# Patient Record
Sex: Male | Born: 1950 | Race: White | Hispanic: No | Marital: Married | Smoking: Former smoker
Health system: Southern US, Community
[De-identification: ages and names within clinical notes are randomized; demographics above are authoritative.]

## PROBLEM LIST (undated history)

## (undated) DIAGNOSIS — Z9109 Other allergy status, other than to drugs and biological substances: Secondary | ICD-10-CM

## (undated) DIAGNOSIS — J329 Chronic sinusitis, unspecified: Secondary | ICD-10-CM

## (undated) DIAGNOSIS — E785 Hyperlipidemia, unspecified: Secondary | ICD-10-CM

## (undated) DIAGNOSIS — C439 Malignant melanoma of skin, unspecified: Secondary | ICD-10-CM

## (undated) DIAGNOSIS — H269 Unspecified cataract: Secondary | ICD-10-CM

## (undated) DIAGNOSIS — T7840XA Allergy, unspecified, initial encounter: Secondary | ICD-10-CM

## (undated) DIAGNOSIS — I2510 Atherosclerotic heart disease of native coronary artery without angina pectoris: Secondary | ICD-10-CM

## (undated) HISTORY — DX: Chronic sinusitis, unspecified: J32.9

## (undated) HISTORY — DX: Atherosclerotic heart disease of native coronary artery without angina pectoris: I25.10

## (undated) HISTORY — DX: Hyperlipidemia, unspecified: E78.5

## (undated) HISTORY — DX: Unspecified cataract: H26.9

## (undated) HISTORY — DX: Malignant melanoma of skin, unspecified: C43.9

## (undated) HISTORY — PX: MOHS SURGERY: SUR867

## (undated) HISTORY — DX: Allergy, unspecified, initial encounter: T78.40XA

## (undated) HISTORY — PX: COLONOSCOPY: SHX174

## (undated) HISTORY — DX: Other allergy status, other than to drugs and biological substances: Z91.09

## (undated) HISTORY — PX: POLYPECTOMY: SHX149

---
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
## 1988-11-16 HISTORY — PX: HERNIA REPAIR: SHX51

## 2001-01-10 ENCOUNTER — Encounter: Payer: Self-pay | Admitting: Family Medicine

## 2001-01-10 LAB — CONVERTED CEMR LAB
Blood Glucose, Fasting: 92 mg/dL
PSA: 0.66 ng/mL
WBC, blood: 8.6 10*3/uL

## 2002-07-12 ENCOUNTER — Encounter: Payer: Self-pay | Admitting: Family Medicine

## 2002-07-12 LAB — CONVERTED CEMR LAB
Blood Glucose, Fasting: 105 mg/dL
PSA: 0.8 ng/mL
TSH: 2.47 microintl units/mL

## 2003-07-24 ENCOUNTER — Encounter: Payer: Self-pay | Admitting: Family Medicine

## 2003-07-24 LAB — CONVERTED CEMR LAB
Blood Glucose, Fasting: 102 mg/dL
PSA: 0.6 ng/mL
TSH: 1.83 microintl units/mL

## 2004-09-26 ENCOUNTER — Ambulatory Visit: Payer: Self-pay | Admitting: Family Medicine

## 2004-12-22 ENCOUNTER — Ambulatory Visit: Payer: Self-pay | Admitting: Family Medicine

## 2004-12-22 LAB — CONVERTED CEMR LAB
Blood Glucose, Fasting: 100 mg/dL
PSA: 0.72 ng/mL
TSH: 1.95 microintl units/mL

## 2006-05-06 ENCOUNTER — Ambulatory Visit: Payer: Self-pay | Admitting: Family Medicine

## 2006-09-29 ENCOUNTER — Ambulatory Visit: Payer: Self-pay | Admitting: Family Medicine

## 2006-09-29 LAB — CONVERTED CEMR LAB
Blood Glucose, Fasting: 105 mg/dL
PSA: 0.99 ng/mL
TSH: 2.2 microintl units/mL

## 2006-10-05 ENCOUNTER — Ambulatory Visit: Payer: Self-pay | Admitting: Family Medicine

## 2007-03-09 ENCOUNTER — Ambulatory Visit: Payer: Self-pay | Admitting: Family Medicine

## 2007-03-24 ENCOUNTER — Telehealth (INDEPENDENT_AMBULATORY_CARE_PROVIDER_SITE_OTHER): Payer: Self-pay | Admitting: *Deleted

## 2007-10-10 ENCOUNTER — Telehealth (INDEPENDENT_AMBULATORY_CARE_PROVIDER_SITE_OTHER): Payer: Self-pay | Admitting: *Deleted

## 2008-12-31 ENCOUNTER — Ambulatory Visit: Payer: Self-pay | Admitting: Family Medicine

## 2008-12-31 LAB — CONVERTED CEMR LAB
ALT: 21 units/L (ref 0–53)
AST: 18 units/L (ref 0–37)
Albumin: 3.7 g/dL (ref 3.5–5.2)
Alkaline Phosphatase: 62 units/L (ref 39–117)
BUN: 17 mg/dL (ref 6–23)
Basophils Absolute: 0.1 10*3/uL (ref 0.0–0.1)
Basophils Relative: 1 % (ref 0.0–3.0)
Bilirubin, Direct: 0.1 mg/dL (ref 0.0–0.3)
CO2: 27 meq/L (ref 19–32)
Calcium: 9.4 mg/dL (ref 8.4–10.5)
Chloride: 105 meq/L (ref 96–112)
Cholesterol: 188 mg/dL (ref 0–200)
Creatinine, Ser: 1 mg/dL (ref 0.4–1.5)
Creatinine,U: 262.8 mg/dL
Eosinophils Absolute: 0.1 10*3/uL (ref 0.0–0.7)
Eosinophils Relative: 2 % (ref 0.0–5.0)
GFR calc Af Amer: 99 mL/min
GFR calc non Af Amer: 82 mL/min
Glucose, Bld: 104 mg/dL — ABNORMAL HIGH (ref 70–99)
HCT: 43 % (ref 39.0–52.0)
HDL: 27.1 mg/dL — ABNORMAL LOW (ref >39.0)
Hemoglobin: 15 g/dL (ref 13.0–17.0)
LDL Cholesterol: 138 mg/dL — ABNORMAL HIGH (ref 0–99)
Lymphocytes Relative: 27.8 % (ref 12.0–46.0)
MCHC: 34.8 g/dL (ref 30.0–36.0)
MCV: 90.4 fL (ref 78.0–100.0)
Microalb, Ur: 0.2 mg/dL (ref 0.0–1.9)
Monocytes Absolute: 0.8 10*3/uL (ref 0.1–1.0)
Monocytes Relative: 10.7 % (ref 3.0–12.0)
Neutro Abs: 4.1 10*3/uL (ref 1.4–7.7)
Neutrophils Relative %: 58.5 % (ref 43.0–77.0)
PSA: 1.08 ng/mL (ref 0.10–4.00)
Platelets: 176 10*3/uL (ref 150–400)
Potassium: 4.3 meq/L (ref 3.5–5.1)
RBC: 4.76 M/uL (ref 4.22–5.81)
RDW: 13.1 % (ref 11.5–14.6)
Sodium: 140 meq/L (ref 135–145)
TSH: 2.51 microintl units/mL (ref 0.35–5.50)
Total Bilirubin: 0.6 mg/dL (ref 0.3–1.2)
Total CHOL/HDL Ratio: 6.9
Total Protein: 6.5 g/dL (ref 6.0–8.3)
Triglycerides: 113 mg/dL (ref 0–149)
VLDL: 23 mg/dL (ref 0–40)
WBC: 7.1 10*3/uL (ref 4.5–10.5)

## 2009-01-03 ENCOUNTER — Ambulatory Visit: Payer: Self-pay | Admitting: Family Medicine

## 2009-01-04 ENCOUNTER — Encounter: Payer: Self-pay | Admitting: Family Medicine

## 2009-01-07 DIAGNOSIS — N529 Male erectile dysfunction, unspecified: Secondary | ICD-10-CM | POA: Insufficient documentation

## 2009-01-07 DIAGNOSIS — E785 Hyperlipidemia, unspecified: Secondary | ICD-10-CM | POA: Insufficient documentation

## 2009-01-07 DIAGNOSIS — R739 Hyperglycemia, unspecified: Secondary | ICD-10-CM | POA: Insufficient documentation

## 2009-01-07 DIAGNOSIS — T7840XA Allergy, unspecified, initial encounter: Secondary | ICD-10-CM | POA: Insufficient documentation

## 2009-06-18 ENCOUNTER — Telehealth: Payer: Self-pay | Admitting: Family Medicine

## 2010-01-01 ENCOUNTER — Encounter (INDEPENDENT_AMBULATORY_CARE_PROVIDER_SITE_OTHER): Payer: Self-pay | Admitting: *Deleted

## 2010-01-06 ENCOUNTER — Ambulatory Visit: Payer: Self-pay | Admitting: Family Medicine

## 2010-01-06 DIAGNOSIS — F17200 Nicotine dependence, unspecified, uncomplicated: Secondary | ICD-10-CM | POA: Insufficient documentation

## 2010-01-06 LAB — CONVERTED CEMR LAB
ALT: 17 units/L (ref 0–53)
AST: 17 units/L (ref 0–37)
Albumin: 3.7 g/dL (ref 3.5–5.2)
Alkaline Phosphatase: 73 units/L (ref 39–117)
BUN: 13 mg/dL (ref 6–23)
Basophils Absolute: 0 10*3/uL (ref 0.0–0.1)
Basophils Relative: 0.4 % (ref 0.0–3.0)
Bilirubin, Direct: 0.1 mg/dL (ref 0.0–0.3)
CO2: 27 meq/L (ref 19–32)
Calcium: 8.7 mg/dL (ref 8.4–10.5)
Chloride: 110 meq/L (ref 96–112)
Cholesterol: 182 mg/dL (ref 0–200)
Creatinine, Ser: 0.9 mg/dL (ref 0.4–1.5)
Creatinine,U: 236.1 mg/dL
Eosinophils Absolute: 0.1 10*3/uL (ref 0.0–0.7)
Eosinophils Relative: 2 % (ref 0.0–5.0)
GFR calc non Af Amer: 92.01 mL/min (ref >60)
Glucose, Bld: 99 mg/dL (ref 70–99)
HCT: 44.8 % (ref 39.0–52.0)
HDL: 36.2 mg/dL — ABNORMAL LOW (ref >39.00)
Hemoglobin: 15 g/dL (ref 13.0–17.0)
LDL Cholesterol: 127 mg/dL — ABNORMAL HIGH (ref 0–99)
Lymphocytes Relative: 27.6 % (ref 12.0–46.0)
Lymphs Abs: 1.8 10*3/uL (ref 0.7–4.0)
MCHC: 33.5 g/dL (ref 30.0–36.0)
MCV: 92.6 fL (ref 78.0–100.0)
Microalb Creat Ratio: 3 mg/g (ref 0.0–30.0)
Microalb, Ur: 0.7 mg/dL (ref 0.0–1.9)
Monocytes Absolute: 0.5 10*3/uL (ref 0.1–1.0)
Monocytes Relative: 8.2 % (ref 3.0–12.0)
Neutro Abs: 4 10*3/uL (ref 1.4–7.7)
Neutrophils Relative %: 61.8 % (ref 43.0–77.0)
PSA: 0.86 ng/mL (ref 0.10–4.00)
Platelets: 163 10*3/uL (ref 150.0–400.0)
Potassium: 4.3 meq/L (ref 3.5–5.1)
RBC: 4.84 M/uL (ref 4.22–5.81)
RDW: 12.8 % (ref 11.5–14.6)
Sodium: 142 meq/L (ref 135–145)
TSH: 2.77 microintl units/mL (ref 0.35–5.50)
Total Bilirubin: 0.4 mg/dL (ref 0.3–1.2)
Total CHOL/HDL Ratio: 5
Total Protein: 6.4 g/dL (ref 6.0–8.3)
Triglycerides: 96 mg/dL (ref 0.0–149.0)
VLDL: 19.2 mg/dL (ref 0.0–40.0)
WBC: 6.4 10*3/uL (ref 4.5–10.5)

## 2010-01-08 ENCOUNTER — Ambulatory Visit: Payer: Self-pay | Admitting: Family Medicine

## 2010-01-08 ENCOUNTER — Encounter (INDEPENDENT_AMBULATORY_CARE_PROVIDER_SITE_OTHER): Payer: Self-pay | Admitting: *Deleted

## 2010-01-08 LAB — FECAL OCCULT BLOOD, GUAIAC: Fecal Occult Blood: NEGATIVE

## 2010-01-08 LAB — CONVERTED CEMR LAB
OCCULT 1: NEGATIVE
OCCULT 2: NEGATIVE
OCCULT 3: NEGATIVE

## 2010-05-16 DIAGNOSIS — Z85820 Personal history of malignant melanoma of skin: Secondary | ICD-10-CM | POA: Insufficient documentation

## 2010-05-16 DIAGNOSIS — C439 Malignant melanoma of skin, unspecified: Secondary | ICD-10-CM | POA: Insufficient documentation

## 2010-06-16 DIAGNOSIS — C439 Malignant melanoma of skin, unspecified: Secondary | ICD-10-CM

## 2010-06-16 HISTORY — DX: Malignant melanoma of skin, unspecified: C43.9

## 2010-06-19 ENCOUNTER — Encounter: Payer: Self-pay | Admitting: Family Medicine

## 2010-06-24 ENCOUNTER — Encounter (INDEPENDENT_AMBULATORY_CARE_PROVIDER_SITE_OTHER): Payer: Self-pay | Admitting: *Deleted

## 2010-12-16 NOTE — Progress Notes (Signed)
Summary: levitra rx  Medications Added LEVITRA 10 MG TABS (VARDENAFIL HCL)  LEVITRA 20 MG  TABS (VARDENAFIL HCL) one tab by mouth q one hour prior       Phone Note Call from Patient Call back at Work Phone [phone]   Caller: Patient Call For: schaller Summary of Call: pt wants levitra rx but he wants in increased to 20 mg Initial call taken by: Liane Comber,  October 10, 2007 4:03 PM  Follow-up for Phone Call        Advised patient.  ......................................................Marland KitchenLiane Comber October 10, 2007 5:06 PM Rx done, was sent electronically. ....................................................Marland KitchenMarcelle Smiling Ames Hoban October 10, 2007 5:06 PM     New/Updated Medications: LEVITRA 10 MG TABS (VARDENAFIL HCL)  LEVITRA 20 MG  TABS (VARDENAFIL HCL) one tab by mouth q one hour prior   Prescriptions: LEVITRA 20 MG  TABS (VARDENAFIL HCL) one tab by mouth q one hour prior  #15/per plan x 12   Entered by:   Liane Comber   Authorized by:   Shaune Leeks MD   Signed by:   Liane Comber on 10/10/2007   Method used:   Electronically sent to ...       [facility] [phone]*       [address]       [place], [place]  [address]       Ph: [phone] or [phone]       Fax: [phone]   RxID:   [id number] LEVITRA 20 MG  TABS (VARDENAFIL HCL) one tab by mouth q one hour prior  #15/per plan x 12   Entered and Authorized by:   Shaune Leeks MD   Signed by:   Shaune Leeks MD on 10/10/2007   Method used:   Print then Give to Patient   RxID:   [id number]

## 2010-12-16 NOTE — Letter (Signed)
Summary: [facility]  [facility]   Imported By: Lanelle Bal 06/30/2010 09:35:59  _____________________________________________________________________  External Attachment:    Type:   Image     Comment:   External Document  Appended Document: [facility]    Clinical Lists Changes  Observations: Added new observation of PAST SURG HX: Appy 1965 HOSP Severe Sinusitis 1968 Hernia Repair 1990 MOHS for melanoma, [facility] 06/2010  (06/30/2010 13:29)       Past History:  Past Surgical History: Appy 1965 HOSP Severe Sinusitis 1968 Hernia Repair 1990 MOHS for melanoma, [facility] 06/2010

## 2010-12-16 NOTE — Progress Notes (Signed)
Summary: asking for ear drops  Phone Note Call from Patient Call back at Home Phone [phone]   Caller: Spouse Call For: Jorge Leeks MD Summary of Call: Pt is having a problem with his right ear-  pain.  He is asking for neosporin ear drops [facility] [address]  I advised his wife he might need an appt first. Initial call taken by: Lowella Petties CMA,  June 18, 2009 1:11 PM  Follow-up for Phone Call        Will assume he has otitis externa. Insure he comes in if sxs don't improve...use at least one week. Follow-up by: Jorge Leeks MD,  June 18, 2009 1:28 PM  Additional Follow-up for Phone Call Additional follow up Details #1::        Advised pt's wife. Additional Follow-up by: Lowella Petties CMA,  June 18, 2009 2:30 PM    New/Updated Medications: CORTISPORIN 3.5-10000-1 SOLN (NEOMYCIN-POLYMYXIN-HC) 2-3 drops affected ear three times a day for one week minimum Prescriptions: CORTISPORIN 3.5-10000-1 SOLN (NEOMYCIN-POLYMYXIN-HC) 2-3 drops affected ear three times a day for one week minimum  #1 bottle x 0   Entered and Authorized by:   Jorge Leeks MD   Signed by:   Jorge Leeks MD on 06/18/2009   Method used:   [facility] to        [facility] [phone] (retail)       [address] [place], [place]  [address]       Ph: [phone] or [phone]       Fax: [phone]   RxID:   [id number]

## 2010-12-16 NOTE — Assessment & Plan Note (Signed)
Summary: CPX/CLE   Vital Signs:  Patient profile:   60 year old male Height:      70.5 inches Weight:      249 pounds BMI:     35.35 Temp:     98.3 degrees F oral Pulse rate:   80 / minute Pulse rhythm:   regular BP sitting:   126 / 88  (left arm) Cuff size:   large  Vitals Entered By: Sydell Axon LPN (January 06, 2010 8:17 AM) CC: 30 Minute checkup, returned hemoccult cards today that were given to him last year at physical   History of Present Illness: Pt here for Comp Exam, doing well with no complaints.  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 As Needed Allergies 2)  Centrum Silver  Tabs (Multiple Vitamins-Minerals) .Marland Kitchen.. 1 Daily By Mouth 3)  Something Over The Counter .... For Levitra 4)  Cortisporin 3.5-10000-1 Soln (Neomycin-Polymyxin-Hc) .... 2-3 Drops Affected Ear Three Times A Day For One Week Minimum  Allergies: No Known Drug Allergies  Past History:  Family History: Last updated: 01/06/2010 Father A 83 Leaky Valve   Catarract Extractions Mother dec 77 Uterine Ca Radiation Colitis Liver and  Kidney Complcns  Hypothyr Sister A 42  Sister A 52 CV: NEGATIVE HBP: POSITIVE MOTHER DM: NEGATIVE GOUT/ARTHRITIS: PROSTATE CANCER: BREAST /OVARIAN/UTERINE  CANCER: 68 HYST MOTHER CHEMO AND RADIATION COLON CANCER: DEPRESSION: NEGATIVE ETOH/DRUG ABUSE: NEGATIVE OTHER: POSITIVE STROKE MGF  Social History: Last updated: 01/03/2009 Occupation:Travelling CPA Married  3  Risk Factors: Alcohol Use: <1 (01/06/2010) Caffeine Use: 0 (01/06/2010) Exercise: yes (01/06/2010)  Risk Factors: Smoking Status: current (01/06/2010) Packs/Day: 1/2 (01/06/2010)  Past Surgical History: Appy 1965 HOSP Severe Sinusitis 1968 Hernia Repair 1990  Family History: Father A 83 Leaky Valve   Catarract Extractions Mother dec 77 Uterine Ca Radiation Colitis Liver and  Kidney Complcns  Hypothyr Sister A 85  Sister A 52 CV: NEGATIVE HBP: POSITIVE MOTHER DM: NEGATIVE GOUT/ARTHRITIS: PROSTATE CANCER: BREAST /OVARIAN/UTERINE CANCER: 68 HYST MOTHER CHEMO AND RADIATION COLON CANCER: DEPRESSION: NEGATIVE ETOH/DRUG ABUSE: NEGATIVE OTHER: POSITIVE STROKE MGF  Social History: Packs/Day:  1/2  Review of Systems General:  Denies chills, fatigue, fever, sweats, weakness, and weight loss. Eyes:  Denies blurring, discharge, and eye pain. ENT:  Denies decreased hearing, ear discharge, and earache. CV:  Denies chest pain or discomfort, fainting, fatigue, palpitations, shortness of breath with exertion, swelling of feet, and swelling of hands. Resp:  Denies cough, shortness of breath, and wheezing. GI:  Denies abdominal pain, bloody stools, change in bowel habits, constipation, dark tarry stools, diarrhea, indigestion, loss of appetite, nausea, vomiting, vomiting blood, and yellowish skin color. GU:  Denies decreased libido, discharge, dysuria, erectile  dysfunction, nocturia, and urinary frequency. MS:  Denies joint pain, joint swelling, low back pain, muscle weakness, and stiffness. Derm:  Denies dryness, itching, and rash. Neuro:  Denies numbness, poor balance, tingling, and tremors.  Physical Exam  General:  Well-developed,well-nourished,in no acute distress;  alert,appropriate and cooperative throughout examination, mildly overweight. Head:  Normocephalic and atraumatic without obvious abnormalities. No apparent alopecia or balding, mild thinning of hair.. Eyes:  Conjunctiva clear bilaterally.  Ears:  External ear exam shows no significant lesions or deformities.  Otoscopic examination reveals clear canals, tympanic membranes are intact bilaterally without bulging, retraction, inflammation or discharge. Hearing is grossly normal bilaterally. Nose:  External nasal examination shows no deformity or inflammation. Nasal mucosa are pink and moist without lesions or exudates. Mouth:  Oral mucosa and oropharynx without lesions or exudates.  Teeth in good repair. Neck:  No deformities, masses, or tenderness noted. Chest Wall:  No deformities, masses, tenderness or gynecomastia noted. Breasts:  No masses or gynecomastia noted Lungs:  Normal respiratory effort, chest expands symmetrically. Lungs are clear to auscultation, no crackles or wheezes. Heart:  Normal rate and regular rhythm. S1 and S2 normal without gallop, murmur, click, rub or other extra sounds. Abdomen:  Bowel sounds positive,abdomen soft and non-tender without masses, organomegaly or hernias noted. Mild protuberance. Rectal:  No external abnormalities noted. Normal sphincter tone. No rectal masses or tenderness. G neg. Genitalia:  Testes bilaterally descended without nodularity, tenderness or masses. No scrotal masses or lesions. No penis lesions or urethral discharge. Prostate:  Prostate gland firm and smooth, no enlargement, nodularity, tenderness, mass, asymmetry or induration. 10 gms. Msk:  No deformity or scoliosis noted of thoracic or lumbar spine.   Pulses:  R and L carotid,radial,femoral,dorsalis pedis and posterior tibial pulses are full and equal bilaterally Extremities:  No clubbing, cyanosis, edema, or deformity noted with normal full range of motion of all joints.   Neurologic:  No  cranial nerve deficits noted. Station and gait are normal. Plantar reflexes are down-going bilaterally. DTRs are symmetrical throughout. Sensory, motor and coordinative functions appear intact. Skin:  Intact without suspicious lesions or rashes, mild mioles throughout. Cervical Nodes:  No lymphadenopathy noted Inguinal Nodes:  No significant adenopathy Psych:  Cognition and judgment appear intact. Alert and cooperative with normal attention span and concentration. 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  Problem # 3:  ALLERGY (ICD-995.3) Assessment: Unchanged Stable. Now taking Zyrtec and stable.  Problem # 4:  SPECIAL SCREENING MALIGNANT NEOPLASM OF PROSTATE (ICD-V76.44) Assessment: Unchanged Will get PSA today, exam stable.  Problem # 5:  NICOTINE ADDICTION (ICD-305.1) Assessment: Unchanged Encouraged to quit. Will help if necessary, declined thusfar.  Problem # 6:  HYPERLIPIDEMIA (ICD-272.4) Assessment: Unchanged Will recheck today. LDL slightly high HDL slightly low last year....if no better or is worse, would suggest Crestor 5mg  to try to improve both. Quickly discussed. Labs Reviewed: SGOT: 18 (12/31/2008)   SGPT: 21 (12/31/2008)   HDL:27.1 (12/31/2008)  LDL:138 (12/31/2008)  Chol:188 (12/31/2008)  Trig:113 (12/31/2008)  Problem # 7:  OTHER ABNORMAL GLUCOSE (ICD-790.29) Assessment: Unchanged  Will recheck today. Has been mildly elevated and encouraged again to be careful with sweets and carbs.  Labs Reviewed: Creat: 1.0 (12/31/2008)     Complete Medication List: 1)  Centrum Silver Tabs (Multiple vitamins-minerals) .Marland Kitchen.. 1 daily by mouth 2)  Something  Over The Counter  .... For levitra 3)  Zyrtec Allergy 10 Mg Caps (Cetirizine hcl) .... As needed 4)   Glucosamine 500 Mg Caps (Glucosamine sulfate) .... Take one by mouth daily  Patient Instructions: 1)  RTC as needed. 2)  Will report labs on phone tree and poss antic Crestor 5 if needed, which we discussed.  Current Allergies (reviewed today): No known allergies   Appended Document: CPX/CLE

## 2010-12-16 NOTE — Letter (Signed)
Summary: Nadara Eaton letter  [place] at [facility]  [address] [place], [place] [address]   Phone: [phone]  Fax: [phone]       06/24/2010 MRN: [id number]  AJENE CARCHI [address] [place], [place]  [address]  Dear Mr. Martelli,  [facility] [place] announce the retirement of Arta Silence, M.D., from full-time practice at the [facility] office effective May 15, 2010 and his plans of returning part-time.  It is important to Dr. Hetty Ely and to our practice that you understand that [facility] has seven physicians in our office for your health care needs.  We will continue to offer the same exceptional care that you have today.    Dr. Hetty Ely has spoken to many of you about his plans for retirement and returning part-time in the fall.   We will continue to work with you through the transition to schedule appointments for you in the office and meet the high standards that [facility] is committed to.   Again, it is with great pleasure that we share the news that Dr. Hetty Ely will return to [facility] in October of 2011 with a reduced schedule.    If you have any questions, or would like to request an appointment with one of our physicians, please call us at [phone] and press the option for Scheduling an appointment.  We take pleasure in providing you with excellent patient care and look forward to seeing you at your next office visit.  Our [facility] Physicians are:  Tillman Abide, M.D. Laurita Quint, M.D. Roxy Manns, M.D. Kerby Nora, M.D. Hannah Beat, M.D. Ruthe Mannan, M.D. We proudly welcomed Raechel Ache, M.D. and Eustaquio Boyden, M.D. to the practice in July/August 2011.  Sincerely,   [facility]

## 2010-12-16 NOTE — Letter (Signed)
Summary: Results Follow up Letter  [name] at [facility]  [address] [place], [place] [address]   Phone: [phone]  Fax: [phone]    01/08/2010 MRN: [id number]  Jorge Benton [address] [place], [place]  [address]  Dear Mr. Fahr,  The following are the results of your recent test(s):  Test         Result    Pap Smear:        Normal _____  Not Normal _____ Comments: ______________________________________________________ Cholesterol: LDL(Bad cholesterol):         Your goal is less than:         HDL (Good cholesterol):       Your goal is more than: Comments:  ______________________________________________________ Mammogram:        Normal _____  Not Normal _____ Comments:  ___________________________________________________________________ Hemoccult:        Normal __X___  Not normal _______ Comments:  Please repeat in one year  _____________________________________________________________________ Other Tests:    We routinely do not discuss normal results over the telephone.  If you desire a copy of the results, or you have any questions about this information we can discuss them at your next office visit.   Sincerely,     Laurita Quint, MD

## 2010-12-16 NOTE — Letter (Signed)
Summary: [place] No Show Letter  [place] at [facility]  [address] [place], [place] [address]   Phone: [phone]  Fax: [phone]    01/01/2010 MRN: [id number]  Jorge Benton [address] [place], [place]  [address]   Dear Mr. Endres,   Our records indicate that you missed your scheduled appointment with ____lab_________________ on _2.16.11___________.  Please contact this office to reschedule your appointment as soon as possible.  It is important that you keep your scheduled appointments with your physician, so we can provide you the best care possible.  Please be advised that there may be a charge for "no show" appointments.    Sincerely,   [place] at [facility]

## 2011-02-06 ENCOUNTER — Other Ambulatory Visit: Payer: Self-pay | Admitting: Family Medicine

## 2011-02-06 DIAGNOSIS — Z125 Encounter for screening for malignant neoplasm of prostate: Secondary | ICD-10-CM

## 2011-02-06 DIAGNOSIS — R7309 Other abnormal glucose: Secondary | ICD-10-CM

## 2011-02-06 DIAGNOSIS — F17200 Nicotine dependence, unspecified, uncomplicated: Secondary | ICD-10-CM

## 2011-02-06 DIAGNOSIS — E785 Hyperlipidemia, unspecified: Secondary | ICD-10-CM

## 2011-02-07 ENCOUNTER — Encounter: Payer: Self-pay | Admitting: Family Medicine

## 2011-02-09 ENCOUNTER — Other Ambulatory Visit (INDEPENDENT_AMBULATORY_CARE_PROVIDER_SITE_OTHER): Payer: BC Managed Care – PPO | Admitting: Family Medicine

## 2011-02-09 DIAGNOSIS — E785 Hyperlipidemia, unspecified: Secondary | ICD-10-CM

## 2011-02-09 DIAGNOSIS — Z125 Encounter for screening for malignant neoplasm of prostate: Secondary | ICD-10-CM

## 2011-02-09 DIAGNOSIS — F17200 Nicotine dependence, unspecified, uncomplicated: Secondary | ICD-10-CM

## 2011-02-09 DIAGNOSIS — R7309 Other abnormal glucose: Secondary | ICD-10-CM

## 2011-02-09 LAB — BASIC METABOLIC PANEL
BUN: 19 mg/dL (ref 6–23)
CO2: 26 mEq/L (ref 19–32)
Calcium: 8.6 mg/dL (ref 8.4–10.5)
Chloride: 107 mEq/L (ref 96–112)
Creatinine, Ser: 1 mg/dL (ref 0.4–1.5)
GFR: 81.17 mL/min (ref >60.00)
Glucose, Bld: 102 mg/dL — ABNORMAL HIGH (ref 70–99)
Potassium: 4.5 mEq/L (ref 3.5–5.1)
Sodium: 137 mEq/L (ref 135–145)

## 2011-02-09 LAB — CBC WITH DIFFERENTIAL/PLATELET
Basophils Absolute: 0 10*3/uL (ref 0.0–0.1)
Basophils Relative: 0.7 % (ref 0.0–3.0)
Eosinophils Absolute: 0.1 10*3/uL (ref 0.0–0.7)
Eosinophils Relative: 2 % (ref 0.0–5.0)
HCT: 43.5 % (ref 39.0–52.0)
Hemoglobin: 15 g/dL (ref 13.0–17.0)
Lymphocytes Relative: 29.4 % (ref 12.0–46.0)
Lymphs Abs: 1.9 10*3/uL (ref 0.7–4.0)
MCHC: 34.4 g/dL (ref 30.0–36.0)
MCV: 91.4 fl (ref 78.0–100.0)
Monocytes Absolute: 0.6 10*3/uL (ref 0.1–1.0)
Monocytes Relative: 9.8 % (ref 3.0–12.0)
Neutro Abs: 3.8 10*3/uL (ref 1.4–7.7)
Neutrophils Relative %: 58.1 % (ref 43.0–77.0)
Platelets: 176 10*3/uL (ref 150.0–400.0)
RBC: 4.76 Mil/uL (ref 4.22–5.81)
RDW: 14 % (ref 11.5–14.6)
WBC: 6.5 10*3/uL (ref 4.5–10.5)

## 2011-02-09 LAB — HEPATIC FUNCTION PANEL
ALT: 19 U/L (ref 0–53)
AST: 17 U/L (ref 0–37)
Albumin: 3.5 g/dL (ref 3.5–5.2)
Alkaline Phosphatase: 68 U/L (ref 39–117)
Bilirubin, Direct: 0.1 mg/dL (ref 0.0–0.3)
Total Bilirubin: 0.4 mg/dL (ref 0.3–1.2)
Total Protein: 5.9 g/dL — ABNORMAL LOW (ref 6.0–8.3)

## 2011-02-09 LAB — LIPID PANEL
Cholesterol: 180 mg/dL (ref 0–200)
HDL: 33.3 mg/dL — ABNORMAL LOW (ref >39.00)
LDL Cholesterol: 130 mg/dL — ABNORMAL HIGH (ref 0–99)
Total CHOL/HDL Ratio: 5
Triglycerides: 86 mg/dL (ref 0.0–149.0)
VLDL: 17.2 mg/dL (ref 0.0–40.0)

## 2011-02-09 LAB — MICROALBUMIN / CREATININE URINE RATIO
Creatinine,U: 137.8 mg/dL
Microalb Creat Ratio: 0.3 mg/g (ref 0.0–30.0)
Microalb, Ur: 0.4 mg/dL (ref 0.0–1.9)

## 2011-02-09 LAB — PSA: PSA: 0.97 ng/mL (ref 0.10–4.00)

## 2011-02-09 LAB — TSH: TSH: 2.63 u[IU]/mL (ref 0.35–5.50)

## 2011-02-09 NOTE — Progress Notes (Signed)
Addended byMills Koller on: 02/09/2011 08:50 AM   Modules accepted: Orders

## 2011-02-12 ENCOUNTER — Encounter: Payer: Self-pay | Admitting: Family Medicine

## 2011-02-12 ENCOUNTER — Ambulatory Visit (INDEPENDENT_AMBULATORY_CARE_PROVIDER_SITE_OTHER): Payer: BC Managed Care – PPO | Admitting: Family Medicine

## 2011-02-12 ENCOUNTER — Telehealth: Payer: Self-pay | Admitting: *Deleted

## 2011-02-12 DIAGNOSIS — E785 Hyperlipidemia, unspecified: Secondary | ICD-10-CM

## 2011-02-12 DIAGNOSIS — F17200 Nicotine dependence, unspecified, uncomplicated: Secondary | ICD-10-CM

## 2011-02-12 DIAGNOSIS — C439 Malignant melanoma of skin, unspecified: Secondary | ICD-10-CM

## 2011-02-12 DIAGNOSIS — R7309 Other abnormal glucose: Secondary | ICD-10-CM

## 2011-02-12 MED ORDER — VARENICLINE TARTRATE 1 MG PO TABS: 1.0000 mg | ORAL_TABLET | Freq: Two times a day (BID) | ORAL | Status: DC

## 2011-02-12 NOTE — Telephone Encounter (Signed)
Printed script that yo\u can fax to the pharmacy.

## 2011-02-12 NOTE — Progress Notes (Signed)
  Subjective:    Patient ID: Jorge Benton, male    DOB: Feb 20, 1951, 60 y.o.   MRN: [id number]  HPI Pt here for Comp Exam, feels well ewith no complaints. He is a smoker and ready to quit. 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      Review of Systems  Musculoskeletal:       Mild discomfort of the elbows and is now on glucosamine.       Objective:   Physical Exam        Assessment & Plan:

## 2011-02-12 NOTE — Assessment & Plan Note (Signed)
Wants to start Chantix to try quitting. Has had script before but has never used it. Discussed side effects.

## 2011-02-12 NOTE — Assessment & Plan Note (Signed)
Good Nos but try to get LDL lower by avoiding fatty foods.  Lab Results  Component Value Date   CHOL 180 02/09/2011   CHOL 182 01/06/2010   CHOL 188 12/31/2008   Lab Results  Component Value Date   HDL 33.30* 02/09/2011   HDL 36.20* 01/06/2010   HDL 27.1* 12/31/2008   Lab Results  Component Value Date   LDLCALC 130* 02/09/2011   LDLCALC 127* 01/06/2010   LDLCALC 138* 12/31/2008   Lab Results  Component Value Date   TRIG 86.0 02/09/2011   TRIG 96.0 01/06/2010   TRIG 113 12/31/2008   Lab Results  Component Value Date   CHOLHDL 5 02/09/2011   CHOLHDL 5 01/06/2010   CHOLHDL 6.9 CALC 12/31/2008

## 2011-02-12 NOTE — Telephone Encounter (Signed)
Pt was in this morning,lost his script for chantix.  He is asking for [facility] [address]

## 2011-02-12 NOTE — Assessment & Plan Note (Signed)
Still slightly high. Discussed avoiding sweets and carbs.

## 2011-02-13 MED ORDER — VARENICLINE TARTRATE 1 MG PO TABS: 1.0000 mg | ORAL_TABLET | Freq: Two times a day (BID) | ORAL | Status: AC

## 2011-02-13 NOTE — Telephone Encounter (Signed)
[facility] [address]

## 2012-02-15 ENCOUNTER — Other Ambulatory Visit: Payer: BC Managed Care – PPO

## 2012-02-18 ENCOUNTER — Encounter: Payer: BC Managed Care – PPO | Admitting: Family Medicine

## 2012-02-19 ENCOUNTER — Encounter: Payer: BC Managed Care – PPO | Admitting: Family Medicine

## 2012-02-24 ENCOUNTER — Other Ambulatory Visit: Payer: BC Managed Care – PPO

## 2012-04-16 ENCOUNTER — Other Ambulatory Visit: Payer: Self-pay | Admitting: Family Medicine

## 2012-04-16 DIAGNOSIS — Z125 Encounter for screening for malignant neoplasm of prostate: Secondary | ICD-10-CM

## 2012-04-16 DIAGNOSIS — E7800 Pure hypercholesterolemia, unspecified: Secondary | ICD-10-CM

## 2012-04-25 ENCOUNTER — Other Ambulatory Visit (INDEPENDENT_AMBULATORY_CARE_PROVIDER_SITE_OTHER): Payer: BC Managed Care – PPO

## 2012-04-25 DIAGNOSIS — E7800 Pure hypercholesterolemia, unspecified: Secondary | ICD-10-CM

## 2012-04-25 DIAGNOSIS — Z125 Encounter for screening for malignant neoplasm of prostate: Secondary | ICD-10-CM

## 2012-04-25 LAB — COMPREHENSIVE METABOLIC PANEL
ALT: 21 U/L (ref 0–53)
AST: 21 U/L (ref 0–37)
Albumin: 3.8 g/dL (ref 3.5–5.2)
Alkaline Phosphatase: 70 U/L (ref 39–117)
BUN: 19 mg/dL (ref 6–23)
CO2: 26 mEq/L (ref 19–32)
Calcium: 8.9 mg/dL (ref 8.4–10.5)
Chloride: 107 mEq/L (ref 96–112)
Creatinine, Ser: 1 mg/dL (ref 0.4–1.5)
GFR: 79.02 mL/min (ref >60.00)
Glucose, Bld: 102 mg/dL — ABNORMAL HIGH (ref 70–99)
Potassium: 4.5 mEq/L (ref 3.5–5.1)
Sodium: 141 mEq/L (ref 135–145)
Total Bilirubin: 0.5 mg/dL (ref 0.3–1.2)
Total Protein: 6.1 g/dL (ref 6.0–8.3)

## 2012-04-25 LAB — LIPID PANEL
Cholesterol: 169 mg/dL (ref 0–200)
HDL: 30.6 mg/dL — ABNORMAL LOW (ref >39.00)
LDL Cholesterol: 124 mg/dL — ABNORMAL HIGH (ref 0–99)
Total CHOL/HDL Ratio: 6
Triglycerides: 71 mg/dL (ref 0.0–149.0)
VLDL: 14.2 mg/dL (ref 0.0–40.0)

## 2012-04-25 LAB — PSA: PSA: 0.91 ng/mL (ref 0.10–4.00)

## 2012-05-02 ENCOUNTER — Encounter: Payer: Self-pay | Admitting: Family Medicine

## 2012-05-02 ENCOUNTER — Ambulatory Visit (INDEPENDENT_AMBULATORY_CARE_PROVIDER_SITE_OTHER): Payer: BC Managed Care – PPO | Admitting: Family Medicine

## 2012-05-02 VITALS — BP 142/86 | HR 81 | Temp 98.2°F | Wt 261.0 lb

## 2012-05-02 DIAGNOSIS — Z2911 Encounter for prophylactic immunotherapy for respiratory syncytial virus (RSV): Secondary | ICD-10-CM

## 2012-05-02 DIAGNOSIS — R7309 Other abnormal glucose: Secondary | ICD-10-CM

## 2012-05-02 DIAGNOSIS — Z659 Problem related to unspecified psychosocial circumstances: Secondary | ICD-10-CM | POA: Insufficient documentation

## 2012-05-02 DIAGNOSIS — Z658 Other specified problems related to psychosocial circumstances: Secondary | ICD-10-CM

## 2012-05-02 DIAGNOSIS — Z23 Encounter for immunization: Secondary | ICD-10-CM

## 2012-05-02 DIAGNOSIS — T7840XA Allergy, unspecified, initial encounter: Secondary | ICD-10-CM

## 2012-05-02 DIAGNOSIS — Z1211 Encounter for screening for malignant neoplasm of colon: Secondary | ICD-10-CM

## 2012-05-02 DIAGNOSIS — F17200 Nicotine dependence, unspecified, uncomplicated: Secondary | ICD-10-CM

## 2012-05-02 DIAGNOSIS — E785 Hyperlipidemia, unspecified: Secondary | ICD-10-CM

## 2012-05-02 DIAGNOSIS — Z0000 Encounter for general adult medical examination without abnormal findings: Secondary | ICD-10-CM

## 2012-05-02 NOTE — Assessment & Plan Note (Signed)
Continue zyrtec and use nasal saline.  Would benefit from stopping smoking.

## 2012-05-02 NOTE — Assessment & Plan Note (Signed)
He'll work on diet and exercise.  Recheck at next CPE.

## 2012-05-02 NOTE — Assessment & Plan Note (Signed)
Consider tx with gum.  Routine use discussed.  He'll consider.

## 2012-05-02 NOTE — Assessment & Plan Note (Signed)
Improved, continue with diet/exercise.

## 2012-05-02 NOTE — Assessment & Plan Note (Signed)
Routine anticipatory guidance given to patient.  See health maintenance. Tetanus 2004 Shingles shot 2013 Flu shot.  Encouraged.   PSA wnl.  We discussed recent recs.  No change in stream.  We agreed not to check next year.   Colon cancer screening.  No blood in stool except for irritation or hemorrhoids.  D/w patient [id number] for colon cancer screening, including IFOB vs. colonoscopy.  Risks and benefits of both were discussed and patient voiced understanding.  Pt elects for: IFOB.

## 2012-05-02 NOTE — Assessment & Plan Note (Signed)
No meds as of today, he'll notify me if he has symptoms related to this.

## 2012-05-02 NOTE — Patient Instructions (Addendum)
I would get a flu shot each fall.   I would start walking at a time, several times a week.  Nicotine gum (flavored) would be reasonable.  Chew it a few times and then put in on your gum line.   Use nasal saline for your sinuses.  Get the spray bottle and saline at the pharamacy.  Take care.  Recheck next year.   Go to the lab on the way out.  We'll contact you with your lab report.

## 2012-05-02 NOTE — Progress Notes (Signed)
CPE- See plan.  Routine anticipatory guidance given to patient.  See health maintenance. Tetanus 2004 Shingles shot encouraged.   Flu shot.  Encouraged.   PSA wnl.  We discussed recent recs.  No change in stream.  We agreed not to check next year.   Colon cancer screening.  No blood in stool except for irritation or hemorrhoids.  D/w patient [id number] for colon cancer screening, including IFOB vs. colonoscopy.  Risks and benefits of both were discussed and patient voiced understanding.  Pt elects for: IFOB.    Sinus pressure, rhinorrhea.  Less HA than prev.  Taking zyrtec.  Smoker (he's tried E cig w/o help and chantix with some improvement but not cessation- he didn't have side effects with chantix).  We talked about gum for replacement and he's willing to try.  Not using nasal saline.  Prev was on claritin D.    He's had a lot of social upheaval and changes in his family situation (daughters moving back home, etc).  No Si/Hi.  We agreed that if he has significant symptoms of anxiety, he'd call me and let me know.   Mild hyperglycemia.  Labs d/w pt.  D/w pt about diet and exercise.   PMH and SH reviewed  Meds, vitals, and allergies reviewed.   ROS: See HPI.  Otherwise negative.    GEN: nad, alert and oriented HEENT: mucous membranes moist, tm w/o erythema.  Nasal congestion noted.  NECK: supple w/o LA CV: rrr. PULM: ctab, no inc wob ABD: soft, +bs EXT: no edema SKIN: no acute rash, prev L thigh skin excision well healed and w/o lesion Prostate gland firm and smooth, no enlargement, nodularity, tenderness, mass, asymmetry or induration.

## 2012-09-02 ENCOUNTER — Encounter: Payer: Self-pay | Admitting: Gastroenterology

## 2012-09-02 ENCOUNTER — Other Ambulatory Visit: Payer: BC Managed Care – PPO

## 2012-09-02 ENCOUNTER — Other Ambulatory Visit: Payer: Self-pay | Admitting: Family Medicine

## 2012-09-02 DIAGNOSIS — R195 Other fecal abnormalities: Secondary | ICD-10-CM

## 2012-09-02 DIAGNOSIS — Z1211 Encounter for screening for malignant neoplasm of colon: Secondary | ICD-10-CM

## 2012-09-02 LAB — FECAL OCCULT BLOOD, IMMUNOCHEMICAL: Fecal Occult Bld: POSITIVE

## 2012-09-05 ENCOUNTER — Encounter: Payer: Self-pay | Admitting: *Deleted

## 2012-09-08 ENCOUNTER — Ambulatory Visit: Payer: BC Managed Care – PPO | Admitting: Gastroenterology

## 2012-09-27 ENCOUNTER — Encounter: Payer: Self-pay | Admitting: Gastroenterology

## 2012-09-27 ENCOUNTER — Ambulatory Visit (INDEPENDENT_AMBULATORY_CARE_PROVIDER_SITE_OTHER): Payer: BC Managed Care – PPO | Admitting: Gastroenterology

## 2012-09-27 VITALS — BP 152/82 | HR 75 | Ht 71.5 in | Wt 262.0 lb

## 2012-09-27 DIAGNOSIS — R195 Other fecal abnormalities: Secondary | ICD-10-CM

## 2012-09-27 NOTE — Progress Notes (Signed)
History of Present Illness:  This is a 61 year old Caucasian male who is asymptomatic gastrointestinal standpoint. He recently had guaiac positive stools on Hemoccult testing. He apparently has" hemorrhoids", but otherwise denies rectal bleeding or any gastrointestinal symptoms. He is no history of anemia, or any significant medical problems. He has not had previous sigmoidoscopy or colonoscopy. Patient referred by primary care for consideration of colonoscopy.  I have reviewed this patient's present history, medical and surgical past history, allergies and medications.     ROS: The remainder of the 10 point ROS is negative     Physical Exam: General well developed well nourished patient in no acute distress, appearing their stated age Eyes PERRLA, no icterus, fundoscopic exam per opthamologist Skin no lesions noted Neck supple, no adenopathy, no thyroid enlargement, no tenderness Chest clear to percussion and auscultation Heart no significant murmurs, gallops or rubs noted Abdomen no hepatosplenomegaly masses or tenderness, BS normal.  Extremities no acute joint lesions, edema, phlebitis or evidence of cellulitis. Neurologic patient oriented x 3, cranial nerves intact, no focal neurologic deficits noted. Psychological mental status normal and normal affect.  Assessment and plan: Probable colon polyp versus hemorrhoidal bleeding. I agree that colonoscopy is indicated, this is been scheduled with propofol sedation at his convenience. Risk and benefits of this procedure have been explained in detail, and he is agreed to proceed as planned.  Encounter Diagnosis  Name Primary?  . Heme positive stool Yes

## 2012-09-27 NOTE — Patient Instructions (Addendum)
You have been given a separate informational sheet regarding your tobacco use, the importance of quitting and local resources to help you quit. You have been scheduled for an endoscopy with propofol. Please follow written instructions given to you at your visit today. If you use inhalers (even only as needed) or a CPAP machine, please bring them with you on the day of your procedure. 

## 2012-10-10 ENCOUNTER — Encounter: Payer: Self-pay | Admitting: *Deleted

## 2012-10-10 ENCOUNTER — Ambulatory Visit: Payer: BC Managed Care – PPO | Admitting: Gastroenterology

## 2012-10-10 ENCOUNTER — Encounter: Payer: Self-pay | Admitting: Gastroenterology

## 2012-10-10 VITALS — Temp 97.3°F | Ht 71.0 in | Wt 262.0 lb

## 2012-10-10 MED ORDER — PEG-KCL-NACL-NASULF-NA ASC-C 100 G PO SOLR: ORAL | Status: DC

## 2012-10-10 MED ORDER — SODIUM CHLORIDE 0.9 % IV SOLN
500.0000 mL | INTRAVENOUS | Status: DC
Start: 2012-10-10 — End: 2012-10-11

## 2012-10-10 NOTE — Telephone Encounter (Signed)
Opened in error

## 2012-10-10 NOTE — Patient Instructions (Addendum)
Instructions for pt's colonoscopy given to him and wife.  His colonoscopy is scheduled for 10-12-12 at 8:30 a.m.

## 2012-10-10 NOTE — Progress Notes (Signed)
1454 Case cancelled per dr Jarold Motto; pt will be scheduled for a colonoscopy

## 2012-10-11 NOTE — Progress Notes (Addendum)
d 

## 2012-10-12 ENCOUNTER — Encounter: Payer: Self-pay | Admitting: Gastroenterology

## 2012-10-12 ENCOUNTER — Ambulatory Visit (AMBULATORY_SURGERY_CENTER): Payer: BC Managed Care – PPO | Admitting: Gastroenterology

## 2012-10-12 VITALS — BP 145/88 | HR 66 | Temp 96.9°F | Resp 14 | Ht 71.5 in | Wt 262.0 lb

## 2012-10-12 DIAGNOSIS — D126 Benign neoplasm of colon, unspecified: Secondary | ICD-10-CM

## 2012-10-12 DIAGNOSIS — R195 Other fecal abnormalities: Secondary | ICD-10-CM

## 2012-10-12 MED ORDER — SODIUM CHLORIDE 0.9 % IV SOLN: 500.0000 mL | INTRAVENOUS | Status: DC

## 2012-10-12 NOTE — Patient Instructions (Addendum)
YOU HAD AN ENDOSCOPIC PROCEDURE TODAY AT [facility]: Refer to the procedure report that was given to you for any specific questions about what was found during the examination.  If the procedure report does not answer your questions, please call your gastroenterologist to clarify.  If you requested that your care partner not be given the details of your procedure findings, then the procedure report has been included in a sealed envelope for you to review at your convenience later.  YOU SHOULD EXPECT: Some feelings of bloating in the abdomen. Passage of more gas than usual.  Walking can help get rid of the air that was put into your GI tract during the procedure and reduce the bloating. If you had a lower endoscopy (such as a colonoscopy or flexible sigmoidoscopy) you may notice spotting of blood in your stool or on the toilet paper. If you underwent a bowel prep for your procedure, then you may not have a normal bowel movement for a few days.  DIET: Your first meal following the procedure should be a light meal and then it is ok to progress to your normal diet.  A half-sandwich or bowl of soup is an example of a good first meal.  Heavy or fried foods are harder to digest and may make you feel nauseous or bloated.  Likewise meals heavy in dairy and vegetables can cause extra gas to form and this can also increase the bloating.  Drink plenty of fluids but you should avoid alcoholic beverages for 24 hours.  ACTIVITY: Your care partner should take you home directly after the procedure.  You should plan to take it easy, moving slowly for the rest of the day.  You can resume normal activity the day after the procedure however you should NOT DRIVE or use heavy machinery for 24 hours (because of the sedation medicines used during the test).    SYMPTOMS TO REPORT IMMEDIATELY: A gastroenterologist can be reached at any hour.  During normal business hours, 8:30 AM to 5:00 PM Monday through Friday,  call [phone].  After hours and on weekends, please call the GI answering service at [phone] who will take a message and have the physician on call contact you.   Following lower endoscopy (colonoscopy or flexible sigmoidoscopy):  Excessive amounts of blood in the stool  Significant tenderness or worsening of abdominal pains  Swelling of the abdomen that is new, acute  Fever of 100F or higher      FOLLOW UP: If any biopsies were taken you will be contacted by phone or by letter within the next 1-3 weeks.  Call your gastroenterologist if you have not heard about the biopsies in 3 weeks.  Our staff will call the home number listed on your records the next business day following your procedure to check on you and address any questions or concerns that you may have at that time regarding the information given to you following your procedure. This is a courtesy call and so if there is no answer at the home number and we have not heard from you through the emergency physician on call, we will assume that you have returned to your regular daily activities without incident.  SIGNATURES/CONFIDENTIALITY: You and/or your care partner have signed paperwork which will be entered into your electronic medical record.  These signatures attest to the fact that that the information above on your After Visit Summary has been reviewed and is understood.  Full responsibility of  the confidentiality of this discharge information lies with you and/or your care-partner.   Polyp information given.  Repeat colonoscopy in 1 year.   Hold Aspirin , aspirin products, and anti-inflammatory meds for next two weeks.

## 2012-10-12 NOTE — Progress Notes (Signed)
Patient did not experience any of the following events: a burn prior to discharge; a fall within the facility; wrong site/side/patient/procedure/implant event; or a hospital transfer or hospital admission upon discharge from the facility. (G8907) Patient did not have preoperative order for IV antibiotic SSI prophylaxis. (G8918)  

## 2012-10-12 NOTE — Progress Notes (Signed)
Propofol given over incremental dosages 

## 2012-10-12 NOTE — Op Note (Signed)
[facility] [address] [place], [address]   COLONOSCOPY PROCEDURE REPORT  PATIENT: Jorge Benton, Jorge Benton  MR#: [id number] BIRTHDATE: Nov 28, 1950 , 61  yrs. old GENDER: Male ENDOSCOPIST: Mardella Layman, MD, [facility] REFERRED BY:  Crawford Givens, M.D. PROCEDURE DATE:  10/12/2012 PROCEDURE:   Colonoscopy with snare polypectomy and Submucosal injection, any substance ASA CLASS:   Class II INDICATIONS:Rectal Bleeding and Colorectal cancer screening. MEDICATIONS: propofol (Diprivan) 200mg  IV  DESCRIPTION OF PROCEDURE:   After the risks and benefits and of the procedure were explained, informed consent was obtained.  A digital rectal exam revealed no abnormalities of the rectum.    The LB CF-[id number]  endoscope was introduced through the anus and advanced to the cecum, which was identified by both the appendix and ileocecal valve .  The quality of the prep was good, using MoviPrep .  The instrument was then slowly withdrawn as the colon was fully examined.     COLON FINDINGS: A fungating pedunculated polyp 3cm. sizedf was found in the mid sigmoid colon.  A polypectomy was performed using hot snare cautery.  The resection was complete and the polyp tissue was completely retrieved.  A 1:10,000 Epinephrine solution injection was given to deliver sclerosing therapy.Smaller flat rectal polyps also snare excised..     Retroflexed views revealed no abnormalities.     The scope was then withdrawn from the patient and the procedure completed.  COMPLICATIONS: There were no complications. ENDOSCOPIC IMPRESSION: Pedunculated polyp was found; polypectomy was performed using snare cautery; injection was given to deliver sclerosing therapy .Large adenoma,r/o atypia.Also multiple smaller left colon polyps noted and some excised.  RECOMMENDATIONS: 1.  Await biopsy results 2.  Hold aspirin, aspirin products, and anti-inflammatory medication for 2 weeks. 3.  Repeat  Colonoscopy in 1 year.   REPEAT EXAM:  cc:  _______________________________ eSignedMardella Layman, MD, [facility] 10/12/2012 8:53 AM     PATIENT NAME:  Jorge Benton, Jorge Benton MR#: [id number]

## 2012-10-17 ENCOUNTER — Telehealth: Payer: Self-pay | Admitting: *Deleted

## 2012-10-17 NOTE — Telephone Encounter (Signed)
  Follow up Call-  Call back number 10/12/2012 10/10/2012  Post procedure Call Back phone  # [phone]-cell  Permission to leave phone message Yes Yes     Patient questions:  Do you have a fever, pain , or abdominal swelling? no Pain Score  0 *  Have you tolerated food without any problems? yes  Have you been able to return to your normal activities? yes  Do you have any questions about your discharge instructions: Diet   no Medications  no Follow up visit  no  Do you have questions or concerns about your Care? no  Actions: * If pain score is 4 or above: No action needed, pain <4.

## 2012-10-21 ENCOUNTER — Encounter: Payer: Self-pay | Admitting: Gastroenterology

## 2013-07-07 ENCOUNTER — Encounter: Payer: Self-pay | Admitting: Gastroenterology

## 2013-08-27 ENCOUNTER — Other Ambulatory Visit: Payer: Self-pay | Admitting: Family Medicine

## 2013-08-27 DIAGNOSIS — R7309 Other abnormal glucose: Secondary | ICD-10-CM

## 2013-09-04 ENCOUNTER — Other Ambulatory Visit (INDEPENDENT_AMBULATORY_CARE_PROVIDER_SITE_OTHER): Payer: BC Managed Care – PPO

## 2013-09-04 DIAGNOSIS — R7309 Other abnormal glucose: Secondary | ICD-10-CM

## 2013-09-04 DIAGNOSIS — E785 Hyperlipidemia, unspecified: Secondary | ICD-10-CM

## 2013-09-04 LAB — BASIC METABOLIC PANEL
BUN: 18 mg/dL (ref 6–23)
CO2: 27 mEq/L (ref 19–32)
Calcium: 8.7 mg/dL (ref 8.4–10.5)
Chloride: 107 mEq/L (ref 96–112)
Creatinine, Ser: 1 mg/dL (ref 0.4–1.5)
GFR: 80.48 mL/min (ref >60.00)
Glucose, Bld: 116 mg/dL — ABNORMAL HIGH (ref 70–99)
Potassium: 4.1 mEq/L (ref 3.5–5.1)
Sodium: 140 mEq/L (ref 135–145)

## 2013-09-04 LAB — LIPID PANEL
Cholesterol: 168 mg/dL (ref 0–200)
HDL: 33.5 mg/dL — ABNORMAL LOW (ref >39.00)
LDL Cholesterol: 119 mg/dL — ABNORMAL HIGH (ref 0–99)
Total CHOL/HDL Ratio: 5
Triglycerides: 77 mg/dL (ref 0.0–149.0)
VLDL: 15.4 mg/dL (ref 0.0–40.0)

## 2013-09-12 ENCOUNTER — Ambulatory Visit (INDEPENDENT_AMBULATORY_CARE_PROVIDER_SITE_OTHER): Payer: BC Managed Care – PPO | Admitting: Family Medicine

## 2013-09-12 ENCOUNTER — Encounter: Payer: Self-pay | Admitting: Family Medicine

## 2013-09-12 VITALS — BP 130/84 | HR 88 | Temp 97.9°F | Ht 71.0 in | Wt 275.5 lb

## 2013-09-12 DIAGNOSIS — Z0000 Encounter for general adult medical examination without abnormal findings: Secondary | ICD-10-CM

## 2013-09-12 DIAGNOSIS — C439 Malignant melanoma of skin, unspecified: Secondary | ICD-10-CM

## 2013-09-12 DIAGNOSIS — Z23 Encounter for immunization: Secondary | ICD-10-CM

## 2013-09-12 DIAGNOSIS — R7309 Other abnormal glucose: Secondary | ICD-10-CM

## 2013-09-12 NOTE — Progress Notes (Signed)
CPE- See plan.  Routine anticipatory guidance given to patient.  See health maintenance. Diet and weight discussed.  Advised to check [email or web address]. He wants to work on exercise first.  Discussed walking.   Father with MI and going to hospice home locally today.  This hasn't been easy for the patient but "I"m doing about as well as I could expect."   Shingles shot in 2013 Tetanus 2014 Flu shot 2014 Prostate cancer screening and PSA options (with potential risks and benefits of testing vs not testing) were discussed along with recent recs/guidelines.  He declined testing PSA at this point. Repeat colonoscopy due later in 2014.  Living will d/w pt.  Would have his wife designated if incapacitated.  He quit smoking.    PMH and SH reviewed  Meds, vitals, and allergies reviewed.   ROS: See HPI.  Otherwise negative.    GEN: nad, alert and oriented HEENT: mucous membranes moist NECK: supple w/o LA CV: rrr. PULM: ctab, no inc wob ABD: soft, +bs EXT: no edema SKIN: no acute rash

## 2013-09-12 NOTE — Patient Instructions (Signed)
Take care.  Try to walk more and then work on your diet.  Look at [email or web address].org in the meantime.

## 2013-09-13 NOTE — Assessment & Plan Note (Signed)
Routine anticipatory guidance given to patient.  See health maintenance. Diet and weight discussed.  Advised to check [email or web address]. He wants to work on exercise first.  Discussed walking.   Father with MI and going to hospice home locally today.  This hasn't been easy for the patient but "I"m doing about as well as I could expect."   Shingles shot in 2013 Tetanus 2014 Flu shot 2014 Prostate cancer screening and PSA options (with potential risks and benefits of testing vs not testing) were discussed along with recent recs/guidelines.  He declined testing PSA at this point. Repeat colonoscopy due later in 2014.  Living will d/w pt.  Would have his wife designated if incapacitated.  He quit smoking.

## 2013-09-13 NOTE — Assessment & Plan Note (Signed)
D/w pt about diet and exercise, labs.  

## 2013-10-18 ENCOUNTER — Encounter: Payer: Self-pay | Admitting: Gastroenterology

## 2013-11-13 ENCOUNTER — Ambulatory Visit (AMBULATORY_SURGERY_CENTER): Payer: Self-pay

## 2013-11-13 VITALS — Ht 71.0 in | Wt 265.0 lb

## 2013-11-13 DIAGNOSIS — Z860100 Personal history of colon polyps, unspecified: Secondary | ICD-10-CM

## 2013-11-13 MED ORDER — MOVIPREP 100 G PO SOLR: 1.0000 | Freq: Once | ORAL | Status: DC

## 2013-11-14 ENCOUNTER — Encounter: Payer: Self-pay | Admitting: Gastroenterology

## 2013-11-27 ENCOUNTER — Encounter: Payer: Self-pay | Admitting: Gastroenterology

## 2013-11-27 ENCOUNTER — Ambulatory Visit (AMBULATORY_SURGERY_CENTER): Payer: BC Managed Care – PPO | Admitting: Gastroenterology

## 2013-11-27 VITALS — BP 119/81 | HR 66 | Temp 97.7°F | Resp 12 | Ht 71.0 in | Wt 265.0 lb

## 2013-11-27 DIAGNOSIS — D126 Benign neoplasm of colon, unspecified: Secondary | ICD-10-CM

## 2013-11-27 DIAGNOSIS — Z86010 Personal history of colonic polyps: Secondary | ICD-10-CM

## 2013-11-27 MED ORDER — SODIUM CHLORIDE 0.9 % IV SOLN: 500.0000 mL | INTRAVENOUS | Status: DC

## 2013-11-27 NOTE — Patient Instructions (Signed)
YOU HAD AN ENDOSCOPIC PROCEDURE TODAY AT THE [facility]: Refer to the procedure report that was given to you for any specific questions about what was found during the examination.  If the procedure report does not answer your questions, please call your gastroenterologist to clarify.  If you requested that your care partner not be given the details of your procedure findings, then the procedure report has been included in a sealed envelope for you to review at your convenience later.  YOU SHOULD EXPECT: Some feelings of bloating in the abdomen. Passage of more gas than usual.  Walking can help get rid of the air that was put into your GI tract during the procedure and reduce the bloating. If you had a lower endoscopy (such as a colonoscopy or flexible sigmoidoscopy) you may notice spotting of blood in your stool or on the toilet paper. If you underwent a bowel prep for your procedure, then you may not have a normal bowel movement for a few days.  DIET: Your first meal following the procedure should be a light meal and then it is ok to progress to your normal diet.  A half-sandwich or bowl of soup is an example of a good first meal.  Heavy or fried foods are harder to digest and may make you feel nauseous or bloated.  Likewise meals heavy in dairy and vegetables can cause extra gas to form and this can also increase the bloating.  Drink plenty of fluids but you should avoid alcoholic beverages for 24 hours.  ACTIVITY: Your care partner should take you home directly after the procedure.  You should plan to take it easy, moving slowly for the rest of the day.  You can resume normal activity the day after the procedure however you should NOT DRIVE or use heavy machinery for 24 hours (because of the sedation medicines used during the test).    SYMPTOMS TO REPORT IMMEDIATELY: A gastroenterologist can be reached at any hour.  During normal business hours, 8:30 AM to 5:00 PM Monday through Friday,  call [phone].  After hours and on weekends, please call the GI answering service at [phone] who will take a message and have the physician on call contact you.   Following lower endoscopy (colonoscopy or flexible sigmoidoscopy):  Excessive amounts of blood in the stool  Significant tenderness or worsening of abdominal pains  Swelling of the abdomen that is new, acute  Fever of 100F or higher  FOLLOW UP: If any biopsies were taken you will be contacted by phone or by letter within the next 1-3 weeks.  Call your gastroenterologist if you have not heard about the biopsies in 3 weeks.  Our staff will call the home number listed on your records the next business day following your procedure to check on you and address any questions or concerns that you may have at that time regarding the information given to you following your procedure. This is a courtesy call and so if there is no answer at the home number and we have not heard from you through the emergency physician on call, we will assume that you have returned to your regular daily activities without incident.  SIGNATURES/CONFIDENTIALITY: You and/or your care partner have signed paperwork which will be entered into your electronic medical record.  These signatures attest to the fact that that the information above on your After Visit Summary has been reviewed and is understood.  Full responsibility of the confidentiality of this   discharge information lies with you and/or your care-partner.    Resume medications. Information given on polyps with discharge instructions. 

## 2013-11-27 NOTE — Progress Notes (Signed)
Called to room to assist during endoscopic procedure.  Patient ID and intended procedure confirmed with present staff. Received instructions for my participation in the procedure from the performing physician.  

## 2013-11-27 NOTE — Progress Notes (Signed)
Stable to RR 

## 2013-11-27 NOTE — Op Note (Signed)
[facility]  [address] [place], [id number]   COLONOSCOPY PROCEDURE REPORT  PATIENT: Jorge, Benton  MR#: [id number] BIRTHDATE: 1951/08/10 , 62  yrs. old GENDER: Male ENDOSCOPIST: Sable Feil, MD, [facility] REFERRED BY: PROCEDURE DATE:  11/27/2013 PROCEDURE:   Colonoscopy, surveillance First Screening Colonoscopy - Avg.  risk and is 50 yrs.  old or older - No.      History of Adenoma - Now for follow-up colonoscopy & has been > or = to 3 yrs.  Yes hx of adenoma.  Has been 3 or more years since last colonoscopy.  Polyps Removed Today? Yes. ASA CLASS:   Class II INDICATIONS: MEDICATIONS: propofol (Diprivan) 200mg  IV  DESCRIPTION OF PROCEDURE:   After the risks benefits and alternatives of the procedure were thoroughly explained, informed consent was obtained.  A digital rectal exam revealed no abnormalities of the rectum.   The LB OF-BP102 [id number]  endoscope was introduced through the anus and advanced to the cecum, which was identified by both the appendix and ileocecal valve. No adverse events experienced.   The quality of the prep was excellent, using MoviPrep  The instrument was then slowly withdrawn as the colon was fully examined.      COLON FINDINGS: A smooth sessile polyp ranging between 3-13mm in size was found in the transverse colon.  A polypectomy was performed using snare cautery.  The resection was complete and the polyp tissue was completely retrieved.   The colon was otherwise normal. There was no diverticulosis, inflammation, polyps or cancers unless previously stated.  Retroflexed views revealed no abnormalities. The time to cecum=2 minutes 44 seconds.  Withdrawal time=8 minutes 55 seconds.  The scope was withdrawn and the procedure completed. COMPLICATIONS: There were no complications.  ENDOSCOPIC IMPRESSION: 1.   Sessile polyp ranging between 3-40mm in size was found in the transverse colon; polypectomy was performed using  snare cautery,prior large adenomas excised 1 year ago. 2.   The colon was otherwise normal  RECOMMENDATIONS: Repeat Colonoscopy in 5 years.   eSigned:  Sable Feil, MD, [facility] 11/27/2013 11:35 AM   cc:

## 2013-11-27 NOTE — Progress Notes (Signed)
Called to room post procedure. Verified with Dr. Sharlett Iles location of polyp.

## 2013-11-28 ENCOUNTER — Telehealth: Payer: Self-pay

## 2013-11-28 NOTE — Telephone Encounter (Signed)
  Follow up Call-  Call back number 11/27/2013 10/12/2012 10/10/2012  Post procedure Call Back phone  # [phone]  Permission to leave phone message Yes Yes Yes     Patient questions:  Do you have a fever, pain , or abdominal swelling? no Pain Score  0 *  Have you tolerated food without any problems? yes  Have you been able to return to your normal activities? yes  Do you have any questions about your discharge instructions: Diet   no Medications  no Follow up visit  no  Do you have questions or concerns about your Care? no  Actions: * If pain score is 4 or above: No action needed, pain <4.

## 2013-11-30 ENCOUNTER — Encounter: Payer: Self-pay | Admitting: Gastroenterology

## 2014-06-25 ENCOUNTER — Encounter: Payer: Self-pay | Admitting: Family Medicine

## 2014-06-25 ENCOUNTER — Ambulatory Visit (INDEPENDENT_AMBULATORY_CARE_PROVIDER_SITE_OTHER): Payer: BC Managed Care – PPO | Admitting: Family Medicine

## 2014-06-25 VITALS — BP 128/80 | HR 74 | Temp 97.8°F | Wt 267.8 lb

## 2014-06-25 DIAGNOSIS — M7650 Patellar tendinitis, unspecified knee: Secondary | ICD-10-CM

## 2014-06-25 DIAGNOSIS — M7652 Patellar tendinitis, left knee: Secondary | ICD-10-CM

## 2014-06-25 NOTE — Progress Notes (Signed)
Pre visit review using our clinic review tool, if applicable. No additional management support is needed unless otherwise documented below in the visit note.  He would occ have knee pain, over the years.  Would resolve, until now.  L knee pain.  Pain worse at night.  Worse in the last year.  No pain sitting as long as he is using a patellar strap.  Anterior pain, worse stooping over.  Is going to get massages already, sometimes with some relief.  Chiropractor didn't help.  Still walking 5 miles a day, when he gets moving the pain gets some better at that point but then tends to be worse on the nights following high activity days.    Meds, vitals, and allergies reviewed.   ROS: See HPI.  Otherwise, noncontributory.  nad ncat L knee with normal inspection Not puffy, not bruised.  Minimal crepitus on ROM Medial and lateral joint line not ttp No meniscal click.  Patellar ligament ttp, esp with extremes of flexion, improved with local compression during ROM L>R quad weakness, sig L abductor weakness noted.

## 2014-06-25 NOTE — Patient Instructions (Signed)
Likely patellar tendonitis.  Keep using the strap as is.  You can ice it down if needed.  Straight leg raise, externally rotated leg raises, then side leg lifts. No extra weight.  Start with 5 each on each leg, gradually work up to sets of 20.  Take care.

## 2014-06-26 DIAGNOSIS — M7650 Patellar tendinitis, unspecified knee: Secondary | ICD-10-CM | POA: Insufficient documentation

## 2014-06-26 NOTE — Assessment & Plan Note (Signed)
Typical exam, knee stable ow.  D/w pt.  Use a strap as is. Work on [employer].  Start with 5-->20 sets of SLR,ex rotated SLR, and side leg lifts.  F/u prn.  He agrees.  See instructions.

## 2014-07-30 ENCOUNTER — Ambulatory Visit (INDEPENDENT_AMBULATORY_CARE_PROVIDER_SITE_OTHER)
Admission: RE | Admit: 2014-07-30 | Discharge: 2014-07-30 | Disposition: A | Discharge status: Home / Self Care | Payer: BC Managed Care – PPO | Source: Ambulatory Visit | Attending: Family Medicine | Admitting: Family Medicine

## 2014-07-30 ENCOUNTER — Encounter: Payer: Self-pay | Admitting: Family Medicine

## 2014-07-30 ENCOUNTER — Ambulatory Visit (INDEPENDENT_AMBULATORY_CARE_PROVIDER_SITE_OTHER): Payer: BC Managed Care – PPO | Admitting: Family Medicine

## 2014-07-30 VITALS — BP 140/88 | HR 79 | Temp 98.0°F | Resp 18 | Wt 270.8 lb

## 2014-07-30 DIAGNOSIS — M25562 Pain in left knee: Secondary | ICD-10-CM

## 2014-07-30 DIAGNOSIS — M7652 Patellar tendinitis, left knee: Secondary | ICD-10-CM

## 2014-07-30 DIAGNOSIS — M25569 Pain in unspecified knee: Secondary | ICD-10-CM

## 2014-07-30 DIAGNOSIS — M7650 Patellar tendinitis, unspecified knee: Secondary | ICD-10-CM

## 2014-07-30 MED ORDER — TRAMADOL HCL 50 MG PO TABS: 50.0000 mg | ORAL_TABLET | Freq: Three times a day (TID) | ORAL | Status: DC | PRN

## 2014-07-30 NOTE — Patient Instructions (Signed)
Go to the lab on the way out.  We'll contact you with your xray report. Let me see the films and consider some options.  Use the strap, ice, excedrin, and tramadol in the meantime. Take care.

## 2014-07-30 NOTE — Progress Notes (Signed)
Pre visit review using our clinic review tool, if applicable. No additional management support is needed unless otherwise documented below in the visit note.  L knee pain continues.  Activity level had increased recently. Nighttime pain is worse than prev.  More pain with flexion of the knee, much more trouble getting a shoe on or off.  Still using a patellar strap in the meantime.  Anterior pain, burning pain.  Same type of pain as prev, but more of it.  Tired to work on leg lifts, but couldn't due to pain.  No locking, sticking.  No swelling, bruising, etc.  More pain going uphill.  He is having to compensate with gait due to pain. Excedrin helps the pain some.  Icing helps some.    Meds, vitals, and allergies reviewed.   ROS: See HPI.  Otherwise, noncontributory.  nad  ncat  L knee with normal inspection  Not puffy, not bruised.  Minimal crepitus on ROM  Medial and lateral joint line not ttp ttp at the quad ligament.

## 2014-07-31 ENCOUNTER — Telehealth: Payer: Self-pay | Admitting: Family Medicine

## 2014-07-31 NOTE — Telephone Encounter (Signed)
Call pt.  I want him to get set up with Dr. Lorelei Pont about his knee.  Thanks.

## 2014-07-31 NOTE — Assessment & Plan Note (Signed)
Worsening, would use tramadol, ice, strap. See notes on imaging.

## 2014-07-31 NOTE — Telephone Encounter (Signed)
Appt scheduled with Dr. Lorelei Pont.

## 2014-08-06 ENCOUNTER — Ambulatory Visit (INDEPENDENT_AMBULATORY_CARE_PROVIDER_SITE_OTHER): Payer: BC Managed Care – PPO | Admitting: Family Medicine

## 2014-08-06 ENCOUNTER — Encounter: Payer: Self-pay | Admitting: Family Medicine

## 2014-08-06 VITALS — BP 122/72 | HR 78 | Temp 97.9°F | Ht 71.0 in | Wt 269.2 lb

## 2014-08-06 DIAGNOSIS — M7650 Patellar tendinitis, unspecified knee: Secondary | ICD-10-CM

## 2014-08-06 DIAGNOSIS — M7652 Patellar tendinitis, left knee: Secondary | ICD-10-CM

## 2014-08-06 MED ORDER — NITROGLYCERIN 0.2 MG/HR TD PT24: MEDICATED_PATCH | TRANSDERMAL | Status: DC

## 2014-08-06 NOTE — Progress Notes (Signed)
Pre visit review using our clinic review tool, if applicable. No additional management support is needed unless otherwise documented below in the visit note. 

## 2014-08-06 NOTE — Progress Notes (Signed)
Dr. Frederico Hamman T. Carry Weesner, MD, [facility] [place], [address] Phone: [phone] Fax: [phone]  08/06/2014  Patient: OSIAS RESNICK, MRN: [id number], DOB: 1951-02-20, 63 y.o.  Primary Physician:  Elsie Stain, MD  Chief Complaint: Knee Pain  Subjective:   RAMELO OETKEN is a 63 y.o. very pleasant male patient known for many years who presents with the following:  Patient presents with 6 month h/o LEFT sided knee pain after no occult injury. Insidious onset. No audible pop was heard. The patient has not had an effusion. No symptomatic giving-way. No mechanical clicking. Joint has not locked up. Patient has been able to walk without limping. The patient does have pain going up stairs, but not down.  Left knee pain, plays golf, walking 5 miles a day. Still working as a Engineer, maintenance (IT).   Pain location: anterior Current physical activity: as above Prior Knee Surgery: none Current pain meds: rare tramadol, tylenol, nsaid Bracing: Cho-pat strap Occupation or school level: Engineer, maintenance (IT)  Past Medical History, Surgical History, Social History, Family History, Problem List, Medications, and Allergies have been reviewed and updated if relevant.  GEN: No fevers, chills. Nontoxic. Primarily MSK c/o today. MSK: Detailed in the HPI GI: tolerating PO intake without difficulty Neuro: No numbness, parasthesias, or tingling associated. Otherwise the pertinent positives of the ROS are noted above.   Objective:   BP 122/72  Pulse 78  Temp(Src) 97.9 F (36.6 C) (Oral)  Ht 5\' 11"  (1.803 m)  Wt 269 lb 4 oz (122.131 kg)  BMI 37.57 kg/m2   GEN: WDWN, NAD, Non-toxic, Alert & Oriented x 3 HEENT: Atraumatic, Normocephalic.  Ears and Nose: No external deformity. EXTR: No clubbing/cyanosis/edema NEURO: Normal gait.  PSYCH: Normally interactive. Conversant. Not depressed or anxious appearing.  Calm demeanor.   Knee:  LEFT Gait: Normal heel  toe pattern ROM: 0-125 Effusion: neg Echymosis or edema: none Patellar tendon: TTP distally Painful PLICA: neg Patellar grind: negative Medial and lateral patellar facet loading: negative medial and lateral joint lines:NT Mcmurray's neg Flexion-pinch neg Varus and valgus stress: stable Lachman: neg Ant and Post drawer: neg Hip abduction, IR, ER: WNL Hip flexion str: 5/5 Hip abd: 5/5 Quad: 5/5 VMO atrophy:No Hamstring concentric and eccentric: 5/5   Radiology: Dg Knee Complete 4 Views Left  07/30/2014   CLINICAL DATA:  Left knee pain  EXAM: LEFT KNEE - COMPLETE 4+ VIEW  COMPARISON:  None.  FINDINGS: Mild patellar spurring along the patellofemoral joint. No significant marginal spurring in the medial or lateral compartments. No significant knee effusion or acute bony findings.  IMPRESSION: 1. Mild patellar spurring. Otherwise, no significant abnormalities are observed.   Electronically Signed   By: Sherryl Barters M.D.   On: 07/30/2014 16:23    Assessment and Plan:   Patellar tendonitis, left - Plan: Ambulatory referral to Physical Therapy  Reviewed anatomy. Reviewed knee rehab from [facility] for patellar tendonitis.  Patellar compression strap recommended for patient. Iontophoresis and PT.  Has migraines - so we decided not to do NTG for tendinopathy   Follow-up: Return in about 2 months (around 10/06/2014).  New Prescriptions   No medications on file   Orders Placed This Encounter  Procedures  . Ambulatory referral to Physical Therapy    Signed,  Frederico Hamman T. Desare Duddy, MD   Patient's Medications  New Prescriptions   No medications on file  Previous Medications   CETIRIZINE (ZYRTEC) 10 MG TABLET  Take 10 mg by mouth daily as needed.     GLUCOSAMINE 500 MG CAPS    Take by mouth daily.     MULTIPLE VITAMINS-MINERALS (CENTRUM SILVER PO)    Take by mouth daily.     TRAMADOL (ULTRAM) 50 MG TABLET    Take 1 tablet (50 mg total) by mouth every 8 (eight) hours  as needed (for pain.  sedation caution).  Modified Medications   No medications on file  Discontinued Medications   No medications on file

## 2014-08-06 NOTE — Patient Instructions (Signed)

## 2014-08-31 ENCOUNTER — Other Ambulatory Visit: Payer: Self-pay

## 2014-09-05 ENCOUNTER — Other Ambulatory Visit: Payer: Self-pay | Admitting: Family Medicine

## 2014-09-05 DIAGNOSIS — E785 Hyperlipidemia, unspecified: Secondary | ICD-10-CM

## 2014-09-10 ENCOUNTER — Other Ambulatory Visit (INDEPENDENT_AMBULATORY_CARE_PROVIDER_SITE_OTHER): Payer: BC Managed Care – PPO

## 2014-09-10 DIAGNOSIS — R7309 Other abnormal glucose: Secondary | ICD-10-CM

## 2014-09-10 DIAGNOSIS — Z0000 Encounter for general adult medical examination without abnormal findings: Secondary | ICD-10-CM

## 2014-09-10 DIAGNOSIS — E785 Hyperlipidemia, unspecified: Secondary | ICD-10-CM

## 2014-09-10 LAB — LIPID PANEL
Cholesterol: 165 mg/dL (ref 0–200)
HDL: 28.1 mg/dL — ABNORMAL LOW (ref >39.00)
LDL Cholesterol: 119 mg/dL — ABNORMAL HIGH (ref 0–99)
NonHDL: 136.9
Total CHOL/HDL Ratio: 6
Triglycerides: 88 mg/dL (ref 0.0–149.0)
VLDL: 17.6 mg/dL (ref 0.0–40.0)

## 2014-09-10 LAB — BASIC METABOLIC PANEL
BUN: 26 mg/dL — ABNORMAL HIGH (ref 6–23)
CO2: 25 mEq/L (ref 19–32)
Calcium: 8.6 mg/dL (ref 8.4–10.5)
Chloride: 107 mEq/L (ref 96–112)
Creatinine, Ser: 1.1 mg/dL (ref 0.4–1.5)
GFR: 75 mL/min (ref >60.00)
Glucose, Bld: 99 mg/dL (ref 70–99)
Potassium: 4.2 mEq/L (ref 3.5–5.1)
Sodium: 140 mEq/L (ref 135–145)

## 2014-09-13 ENCOUNTER — Encounter: Payer: Self-pay | Admitting: Family Medicine

## 2014-09-13 ENCOUNTER — Ambulatory Visit (INDEPENDENT_AMBULATORY_CARE_PROVIDER_SITE_OTHER): Payer: BC Managed Care – PPO | Admitting: Family Medicine

## 2014-09-13 VITALS — BP 138/78 | HR 83 | Temp 97.9°F | Ht 70.25 in | Wt 275.2 lb

## 2014-09-13 DIAGNOSIS — Z23 Encounter for immunization: Secondary | ICD-10-CM

## 2014-09-13 DIAGNOSIS — Z0000 Encounter for general adult medical examination without abnormal findings: Secondary | ICD-10-CM

## 2014-09-13 DIAGNOSIS — R739 Hyperglycemia, unspecified: Secondary | ICD-10-CM

## 2014-09-13 DIAGNOSIS — Z7189 Other specified counseling: Secondary | ICD-10-CM | POA: Insufficient documentation

## 2014-09-13 NOTE — Assessment & Plan Note (Signed)
Routine anticipatory guidance given to patient.  See health maintenance. Tetanus 2014 Flu shot today.  Shingles 2013 PNA at 65 Colonoscopy 2015 Prostate cancer screening and PSA options (with potential risks and benefits of testing vs not testing) were discussed along with recent recs/guidelines.  He declined testing PSA at this point. Living will d/w pt.  Wife designated if patient were incapacitated.  Diet and exercise d/w pt.  Encouraged.  Diet affected by travel, d/w pt re: planning.   Knee pain improved after rehab at PT.  [facility]

## 2014-09-13 NOTE — Patient Instructions (Signed)
Take care.  Glad to see you.  Keep exercising.  Don't change your meds for now.  Recheck in 1 year.

## 2014-09-13 NOTE — Progress Notes (Signed)
Pre visit review using our clinic review tool, if applicable. No additional management support is needed unless otherwise documented below in the visit note.  CPE- See plan.  Routine anticipatory guidance given to patient.  See health maintenance. Tetanus 2014 Flu shot today.  Shingles 2013 PNA at 65 Colonoscopy 2015 Prostate cancer screening and PSA options (with potential risks and benefits of testing vs not testing) were discussed along with recent recs/guidelines.  He declined testing PSA at this point. Living will d/w pt.  Wife designated if patient were incapacitated.  Diet and exercise d/w pt.  Encouraged.  Diet affected by travel, d/w pt re: planning.   Knee pain improved after rehab at PT.  [facility]    PMH and SH reviewed  Meds, vitals, and allergies reviewed.   ROS: See HPI.  Otherwise negative.    GEN: nad, alert and oriented HEENT: mucous membranes moist NECK: supple w/o LA CV: rrr. PULM: ctab, no inc wob ABD: soft, +bs EXT: no edema SKIN: no acute rash

## 2014-09-13 NOTE — Assessment & Plan Note (Signed)
D/w pt re: labs.  He has plans to work on diet and exercise.  Can recheck periodically.

## 2014-10-01 ENCOUNTER — Encounter: Payer: Self-pay | Admitting: Family Medicine

## 2014-10-01 ENCOUNTER — Ambulatory Visit (INDEPENDENT_AMBULATORY_CARE_PROVIDER_SITE_OTHER): Payer: BC Managed Care – PPO | Admitting: Family Medicine

## 2014-10-01 VITALS — BP 130/80 | HR 73 | Temp 98.2°F | Ht 71.0 in | Wt 275.8 lb

## 2014-10-01 DIAGNOSIS — M7652 Patellar tendinitis, left knee: Secondary | ICD-10-CM

## 2014-10-01 NOTE — Progress Notes (Signed)
Pre visit review using our clinic review tool, if applicable. No additional management support is needed unless otherwise documented below in the visit note. 

## 2014-10-01 NOTE — Progress Notes (Signed)
Dr. Frederico Hamman T. Neshia Mckenzie, MD, [facility] [place], [address] Phone: [phone] Fax: [phone]  10/01/2014  Patient: Jorge Benton, MRN: [id number], DOB: 1951-01-08, 63 y.o.  Primary Physician:  Elsie Stain, MD  Chief Complaint: Follow-up  Subjective:   Jorge Benton is a 63 y.o. very pleasant male patient known for many years who presents with the following:  F/u L knee pain and pat tendinopathy:  Knee is doing a lot better. followup with patellar tendinopathy, the patient has been doing physical therapy, has been doing his home exercise program, and he has been doing iontophoresis at physical therapy. He is been very compliant.  08/06/2014 Last OV with Owens Loffler, MD  Patient presents with 6 month h/o LEFT sided knee pain after no occult injury. Insidious onset. No audible pop was heard. The patient has not had an effusion. No symptomatic giving-way. No mechanical clicking. Joint has not locked up. Patient has been able to walk without limping. The patient does have pain going up stairs, but not down.  Left knee pain, plays golf, walking 5 miles a day. Still working as a Engineer, maintenance (IT).   Pain location: anterior Current physical activity: as above Prior Knee Surgery: none Current pain meds: rare tramadol, tylenol, nsaid Bracing: Cho-pat strap Occupation or school level: Engineer, maintenance (IT)  Past Medical History, Surgical History, Social History, Family History, Problem List, Medications, and Allergies have been reviewed and updated if relevant.  GEN: No fevers, chills. Nontoxic. Primarily MSK c/o today. MSK: Detailed in the HPI GI: tolerating PO intake without difficulty Neuro: No numbness, parasthesias, or tingling associated. Otherwise the pertinent positives of the ROS are noted above.   Objective:   BP 130/80 mmHg  Pulse 73  Temp(Src) 98.2 F (36.8 C) (Oral)  Ht 5\' 11"  (1.803 m)  Wt 275 lb 12 oz (125.079 kg)   BMI 38.48 kg/m2   GEN: WDWN, NAD, Non-toxic, Alert & Oriented x 3 HEENT: Atraumatic, Normocephalic.  Ears and Nose: No external deformity. EXTR: No clubbing/cyanosis/edema NEURO: Normal gait.  PSYCH: Normally interactive. Conversant. Not depressed or anxious appearing.  Calm demeanor.   Knee:  LEFT Gait: Normal heel toe pattern ROM: 0-115 Effusion: neg Echymosis or edema: none Patellar tendon: NT Painful PLICA: neg Patellar grind: negative Medial and lateral patellar facet loading: negative medial and lateral joint lines:NT Mcmurray's neg Flexion-pinch neg Varus and valgus stress: stable Lachman: neg Ant and Post drawer: neg Hip abduction, IR, ER: WNL Hip flexion str: 5/5 Hip abd: 5/5 Quad: 5/5 VMO atrophy:No Hamstring concentric and eccentric: 5/5   Radiology: Dg Knee Complete 4 Views Left  07/30/2014   CLINICAL DATA:  Left knee pain  EXAM: LEFT KNEE - COMPLETE 4+ VIEW  COMPARISON:  None.  FINDINGS: Mild patellar spurring along the patellofemoral joint. No significant marginal spurring in the medial or lateral compartments. No significant knee effusion or acute bony findings.  IMPRESSION: 1. Mild patellar spurring. Otherwise, no significant abnormalities are observed.   Electronically Signed   By: Sherryl Barters M.D.   On: 07/30/2014 16:23    Assessment and Plan:   Patellar tendonitis, left  He has done exceedingly well. Discharged to home therapy program and physical activities ad lib. Followup when necessary.  Signed,  Maud Deed. Delmos Velaquez, MD   Patient's Medications  New Prescriptions   No medications on file  Previous Medications   CETIRIZINE (ZYRTEC) 10 MG TABLET    Take 10 mg by mouth  daily as needed.     GLUCOSAMINE 500 MG CAPS    Take by mouth daily.     MULTIPLE VITAMINS-MINERALS (CENTRUM SILVER PO)    Take by mouth daily.    Modified Medications   No medications on file  Discontinued Medications   No medications on file

## 2014-10-08 ENCOUNTER — Ambulatory Visit: Payer: BC Managed Care – PPO | Admitting: Family Medicine

## 2015-09-02 ENCOUNTER — Other Ambulatory Visit (INDEPENDENT_AMBULATORY_CARE_PROVIDER_SITE_OTHER): Payer: BLUE CROSS/BLUE SHIELD

## 2015-09-02 ENCOUNTER — Other Ambulatory Visit: Payer: Self-pay | Admitting: Family Medicine

## 2015-09-02 DIAGNOSIS — E785 Hyperlipidemia, unspecified: Secondary | ICD-10-CM

## 2015-09-02 LAB — LIPID PANEL
Cholesterol: 182 mg/dL (ref 0–200)
HDL: 37.3 mg/dL — ABNORMAL LOW (ref >39.00)
LDL Cholesterol: 126 mg/dL — ABNORMAL HIGH (ref 0–99)
NonHDL: 144.84
Total CHOL/HDL Ratio: 5
Triglycerides: 96 mg/dL (ref 0.0–149.0)
VLDL: 19.2 mg/dL (ref 0.0–40.0)

## 2015-09-02 LAB — BASIC METABOLIC PANEL
BUN: 16 mg/dL (ref 6–23)
CO2: 30 mEq/L (ref 19–32)
Calcium: 9.1 mg/dL (ref 8.4–10.5)
Chloride: 106 mEq/L (ref 96–112)
Creatinine, Ser: 1 mg/dL (ref 0.40–1.50)
GFR: 79.96 mL/min (ref >60.00)
Glucose, Bld: 131 mg/dL — ABNORMAL HIGH (ref 70–99)
Potassium: 4.5 mEq/L (ref 3.5–5.1)
Sodium: 142 mEq/L (ref 135–145)

## 2015-09-09 ENCOUNTER — Other Ambulatory Visit: Payer: BC Managed Care – PPO

## 2015-09-16 ENCOUNTER — Encounter: Payer: Self-pay | Admitting: Family Medicine

## 2015-09-16 ENCOUNTER — Ambulatory Visit (INDEPENDENT_AMBULATORY_CARE_PROVIDER_SITE_OTHER): Payer: BC Managed Care – PPO | Admitting: Family Medicine

## 2015-09-16 VITALS — BP 126/80 | HR 78 | Temp 97.8°F | Ht 71.0 in | Wt 286.5 lb

## 2015-09-16 DIAGNOSIS — Z85820 Personal history of malignant melanoma of skin: Secondary | ICD-10-CM

## 2015-09-16 DIAGNOSIS — R739 Hyperglycemia, unspecified: Secondary | ICD-10-CM

## 2015-09-16 DIAGNOSIS — Z23 Encounter for immunization: Secondary | ICD-10-CM

## 2015-09-16 DIAGNOSIS — Z119 Encounter for screening for infectious and parasitic diseases, unspecified: Secondary | ICD-10-CM

## 2015-09-16 DIAGNOSIS — Z0000 Encounter for general adult medical examination without abnormal findings: Secondary | ICD-10-CM

## 2015-09-16 NOTE — Progress Notes (Signed)
Pre visit review using our clinic review tool, if applicable. No additional management support is needed unless otherwise documented below in the visit note.  CPE- See plan.  Routine anticipatory guidance given to patient.  See health maintenance. Tetanus 2014 Flu shot today.  Shingles 2013 PNA at 65 Colonoscopy 2015 Prostate cancer screening and PSA options (with potential risks and benefits of testing vs not testing) were discussed along with recent recs/guidelines. He declined testing PSA at this point. Living will d/w pt. Wife designated if patient were incapacitated.  Diet and exercise d/w pt. Encouraged both again. Diet affected by travel, d/w pt re: planning.  D/w pt about drink options.  Sugar was up.  Needs more water and less juice.   AAA screening not due.   Hyperglycemia d/w pt.  Eat right diet d/w pt along with exercise.  See AVS re: f/u labs.   Pt opts in for HCV and HIV screening.  D/w pt re: routine screening.    Needs referral to derm to re-est care with new MD.  H/o melanoma.  No new skin lesions.    PMH and SH reviewed  Meds, vitals, and allergies reviewed.   ROS: See HPI.  Otherwise negative.    GEN: nad, alert and oriented HEENT: mucous membranes moist NECK: supple w/o LA CV: rrr. PULM: ctab, no inc wob ABD: soft, +bs EXT: no edema SKIN: no acute rash

## 2015-09-16 NOTE — Patient Instructions (Signed)
Use the eat right diet and try to get your weight down.  Cut out the juices.  Drink more water.  Recheck labs in about 3 months.  We'll go from there.  Take care.  Glad to see you.

## 2015-09-17 NOTE — Assessment & Plan Note (Signed)
Tetanus 2014  Flu shot today.  Shingles 2013  PNA at 65  Colonoscopy 2015  Prostate cancer screening and PSA options (with potential risks and benefits of testing vs not testing) were discussed along with recent recs/guidelines. He declined testing PSA at this point.  Living will d/w pt. Wife designated if patient were incapacitated.  Diet and exercise d/w pt. Encouraged both again. Diet affected by travel, d/w pt re: planning. D/w pt about drink options. Sugar was up. Needs more water and less juice.  AAA screening not due.  Hyperglycemia d/w pt. Eat right diet d/w pt along with exercise.  Pt opts in for HCV and HIV screening. D/w pt re: routine screening.

## 2016-09-14 ENCOUNTER — Other Ambulatory Visit: Payer: Self-pay | Admitting: Family Medicine

## 2016-09-14 DIAGNOSIS — E785 Hyperlipidemia, unspecified: Secondary | ICD-10-CM

## 2016-09-15 ENCOUNTER — Other Ambulatory Visit (INDEPENDENT_AMBULATORY_CARE_PROVIDER_SITE_OTHER): Payer: Medicare Other

## 2016-09-15 ENCOUNTER — Other Ambulatory Visit: Payer: Self-pay | Admitting: Family Medicine

## 2016-09-15 DIAGNOSIS — R739 Hyperglycemia, unspecified: Secondary | ICD-10-CM

## 2016-09-15 DIAGNOSIS — Z119 Encounter for screening for infectious and parasitic diseases, unspecified: Secondary | ICD-10-CM

## 2016-09-15 DIAGNOSIS — E785 Hyperlipidemia, unspecified: Secondary | ICD-10-CM | POA: Diagnosis not present

## 2016-09-15 LAB — LIPID PANEL
Cholesterol: 172 mg/dL (ref 0–200)
HDL: 33.2 mg/dL — ABNORMAL LOW (ref >39.00)
LDL Cholesterol: 119 mg/dL — ABNORMAL HIGH (ref 0–99)
NonHDL: 139.27
Total CHOL/HDL Ratio: 5
Triglycerides: 100 mg/dL (ref 0.0–149.0)
VLDL: 20 mg/dL (ref 0.0–40.0)

## 2016-09-15 LAB — BASIC METABOLIC PANEL
BUN: 21 mg/dL (ref 6–23)
CO2: 26 mEq/L (ref 19–32)
Calcium: 9.2 mg/dL (ref 8.4–10.5)
Chloride: 106 mEq/L (ref 96–112)
Creatinine, Ser: 0.95 mg/dL (ref 0.40–1.50)
GFR: 84.56 mL/min (ref >60.00)
Glucose, Bld: 105 mg/dL — ABNORMAL HIGH (ref 70–99)
Potassium: 4.4 mEq/L (ref 3.5–5.1)
Sodium: 140 mEq/L (ref 135–145)

## 2016-09-15 LAB — HEMOGLOBIN A1C: Hgb A1c MFr Bld: 5.6 % (ref 4.6–6.5)

## 2016-09-16 LAB — HEPATITIS C ANTIBODY: HCV Ab: NEGATIVE

## 2016-09-16 LAB — HIV ANTIBODY (ROUTINE TESTING W REFLEX): HIV 1&2 Ab, 4th Generation: NONREACTIVE

## 2016-09-22 ENCOUNTER — Encounter: Payer: Self-pay | Admitting: Family Medicine

## 2016-09-22 ENCOUNTER — Ambulatory Visit (INDEPENDENT_AMBULATORY_CARE_PROVIDER_SITE_OTHER): Payer: Medicare Other | Admitting: Family Medicine

## 2016-09-22 VITALS — BP 128/82 | HR 79 | Temp 97.6°F | Ht 71.0 in | Wt 271.5 lb

## 2016-09-22 DIAGNOSIS — Z136 Encounter for screening for cardiovascular disorders: Secondary | ICD-10-CM

## 2016-09-22 DIAGNOSIS — Z0000 Encounter for general adult medical examination without abnormal findings: Secondary | ICD-10-CM | POA: Diagnosis not present

## 2016-09-22 DIAGNOSIS — Z23 Encounter for immunization: Secondary | ICD-10-CM

## 2016-09-22 MED ORDER — ACETIC ACID 2 % OT SOLN: 4.0000 [drp] | Freq: Three times a day (TID) | OTIC | 0 refills | Status: DC

## 2016-09-22 NOTE — Progress Notes (Signed)
I have personally reviewed the Medicare Annual Wellness questionnaire and have noted 1. The patient's medical and social history 2. Their use of alcohol, tobacco or illicit drugs 3. Their current medications and supplements 4. The patient's functional ability including ADL's, fall risks, home safety risks and hearing or visual             impairment. 5. Diet and physical activities 6. Evidence for depression or mood disorders  The patients weight, height, BMI have been recorded in the chart and visual acuity is per eye clinic.  I have made referrals, counseling and provided education to the patient based review of the above and I have provided the pt with a written personalized care plan for preventive services.  Provider list updated- see scanned forms.  Routine anticipatory guidance given to patient.  See health maintenance.  Flu 2017 Shingles 2013 PNA 2017 Tetanus 2014 Colonoscopy 2015 Prostate cancer screening and PSA options (with potential risks and benefits of testing vs not testing) were discussed along with recent recs/guidelines.  He declined testing PSA at this point. Advance directive-wife designated if patient were incapacitated. AAA screening discussed with patient. See orders. Cognitive function addressed- see scanned forms- and if abnormal then additional documentation follows.   He has had some right ear canal discharge. No fevers no chills. He wanted this evaluated.  He has some mild left Achilles pain that is improved with stretching. Discussed with patient about getting heel lifts if needed.  Routine screening EKG done today.See notes on EKG. Labs discussed with patient.   PMH and SH reviewed  Meds, vitals, and allergies reviewed.   ROS: Per HPI.  Unless specifically indicated otherwise in HPI, the patient denies:  General: fever. Eyes: acute vision changes ENT: sore throat Cardiovascular: chest pain Respiratory: SOB GI: vomiting GU:  dysuria Musculoskeletal: acute back pain Derm: acute rash Neuro: acute motor dysfunction Psych: worsening mood Endocrine: polydipsia Heme: bleeding Allergy: hayfever  GEN: nad, alert and oriented HEENT: mucous membranes moist, left eardrum and canal within normal limits. Right eardrum intact but there is some debris in the canal that looks like a mild case case of otitis externa. The pinna is not tender. NECK: supple w/o LA CV: rrr. PULM: ctab, no inc wob ABD: soft, +bs EXT: no edema SKIN: no acute rash

## 2016-09-22 NOTE — Progress Notes (Signed)
Pre visit review using our clinic review tool, if applicable. No additional management support is needed unless otherwise documented below in the visit note. 

## 2016-09-22 NOTE — Patient Instructions (Addendum)
Jorge Benton will call about your referral for the AAA ultrasound.   Use the ear drops. Take care.  Glad to see you.  Update me as needed.

## 2016-09-23 NOTE — Assessment & Plan Note (Signed)
Flu 2017 Shingles 2013 PNA 2017 Tetanus 2014 Colonoscopy 2015 Prostate cancer screening and PSA options (with potential risks and benefits of testing vs not testing) were discussed along with recent recs/guidelines.  He declined testing PSA at this point. Advance directive-wife designated if patient were incapacitated. AAA screening discussed with patient. See orders. Cognitive function addressed- see scanned forms- and if abnormal then additional documentation follows.   See notes on EKG. Discussed with patient at office visit. Labs discussed with patient. Mild increase in sugar noted. Needs work on diet and exercise. Recheck yearly. He agrees. Mild otitis externa. See orders. Okay to use rx'd drops as instructed and follow-up as needed. He agrees.

## 2016-10-12 DIAGNOSIS — H2513 Age-related nuclear cataract, bilateral: Secondary | ICD-10-CM | POA: Diagnosis not present

## 2016-10-13 ENCOUNTER — Ambulatory Visit: Payer: Medicare Other

## 2016-10-13 DIAGNOSIS — Z136 Encounter for screening for cardiovascular disorders: Secondary | ICD-10-CM

## 2016-10-15 ENCOUNTER — Other Ambulatory Visit: Payer: Medicare Other

## 2016-10-19 DIAGNOSIS — H6123 Impacted cerumen, bilateral: Secondary | ICD-10-CM | POA: Diagnosis not present

## 2016-10-19 DIAGNOSIS — H90A31 Mixed conductive and sensorineural hearing loss, unilateral, right ear with restricted hearing on the contralateral side: Secondary | ICD-10-CM | POA: Diagnosis not present

## 2016-10-19 DIAGNOSIS — H908 Mixed conductive and sensorineural hearing loss, unspecified: Secondary | ICD-10-CM | POA: Diagnosis not present

## 2016-10-19 DIAGNOSIS — H6060 Unspecified chronic otitis externa, unspecified ear: Secondary | ICD-10-CM | POA: Diagnosis not present

## 2016-12-15 DIAGNOSIS — Z85820 Personal history of malignant melanoma of skin: Secondary | ICD-10-CM | POA: Diagnosis not present

## 2016-12-15 DIAGNOSIS — D2261 Melanocytic nevi of right upper limb, including shoulder: Secondary | ICD-10-CM | POA: Diagnosis not present

## 2016-12-15 DIAGNOSIS — L538 Other specified erythematous conditions: Secondary | ICD-10-CM | POA: Diagnosis not present

## 2016-12-15 DIAGNOSIS — D225 Melanocytic nevi of trunk: Secondary | ICD-10-CM | POA: Diagnosis not present

## 2016-12-15 DIAGNOSIS — Z85828 Personal history of other malignant neoplasm of skin: Secondary | ICD-10-CM | POA: Diagnosis not present

## 2016-12-15 DIAGNOSIS — R208 Other disturbances of skin sensation: Secondary | ICD-10-CM | POA: Diagnosis not present

## 2016-12-15 DIAGNOSIS — L820 Inflamed seborrheic keratosis: Secondary | ICD-10-CM | POA: Diagnosis not present

## 2017-07-26 DIAGNOSIS — Z85828 Personal history of other malignant neoplasm of skin: Secondary | ICD-10-CM | POA: Diagnosis not present

## 2017-07-26 DIAGNOSIS — L820 Inflamed seborrheic keratosis: Secondary | ICD-10-CM | POA: Diagnosis not present

## 2017-07-26 DIAGNOSIS — L728 Other follicular cysts of the skin and subcutaneous tissue: Secondary | ICD-10-CM | POA: Diagnosis not present

## 2017-07-26 DIAGNOSIS — D485 Neoplasm of uncertain behavior of skin: Secondary | ICD-10-CM | POA: Diagnosis not present

## 2017-07-26 DIAGNOSIS — Z85820 Personal history of malignant melanoma of skin: Secondary | ICD-10-CM | POA: Diagnosis not present

## 2017-07-26 DIAGNOSIS — D2261 Melanocytic nevi of right upper limb, including shoulder: Secondary | ICD-10-CM | POA: Diagnosis not present

## 2017-07-26 DIAGNOSIS — D225 Melanocytic nevi of trunk: Secondary | ICD-10-CM | POA: Diagnosis not present

## 2017-07-26 DIAGNOSIS — X32XXXA Exposure to sunlight, initial encounter: Secondary | ICD-10-CM | POA: Diagnosis not present

## 2017-07-26 DIAGNOSIS — L570 Actinic keratosis: Secondary | ICD-10-CM | POA: Diagnosis not present

## 2017-09-22 ENCOUNTER — Other Ambulatory Visit: Payer: Self-pay | Admitting: Family Medicine

## 2017-09-22 DIAGNOSIS — E785 Hyperlipidemia, unspecified: Secondary | ICD-10-CM

## 2017-09-22 DIAGNOSIS — R739 Hyperglycemia, unspecified: Secondary | ICD-10-CM

## 2017-09-23 ENCOUNTER — Ambulatory Visit (INDEPENDENT_AMBULATORY_CARE_PROVIDER_SITE_OTHER): Payer: Medicare Other

## 2017-09-23 VITALS — BP 110/86 | HR 78 | Temp 98.0°F | Ht 70.5 in | Wt 290.5 lb

## 2017-09-23 DIAGNOSIS — E785 Hyperlipidemia, unspecified: Secondary | ICD-10-CM | POA: Diagnosis not present

## 2017-09-23 DIAGNOSIS — Z0000 Encounter for general adult medical examination without abnormal findings: Secondary | ICD-10-CM | POA: Diagnosis not present

## 2017-09-23 DIAGNOSIS — R739 Hyperglycemia, unspecified: Secondary | ICD-10-CM

## 2017-09-23 LAB — COMPREHENSIVE METABOLIC PANEL
ALT: 21 U/L (ref 0–53)
AST: 19 U/L (ref 0–37)
Albumin: 4 g/dL (ref 3.5–5.2)
Alkaline Phosphatase: 68 U/L (ref 39–117)
BUN: 17 mg/dL (ref 6–23)
CO2: 29 mEq/L (ref 19–32)
Calcium: 9.3 mg/dL (ref 8.4–10.5)
Chloride: 105 mEq/L (ref 96–112)
Creatinine, Ser: 0.95 mg/dL (ref 0.40–1.50)
GFR: 84.3 mL/min (ref >60.00)
Glucose, Bld: 91 mg/dL (ref 70–99)
Potassium: 4.2 mEq/L (ref 3.5–5.1)
Sodium: 140 mEq/L (ref 135–145)
Total Bilirubin: 0.5 mg/dL (ref 0.2–1.2)
Total Protein: 6.8 g/dL (ref 6.0–8.3)

## 2017-09-23 LAB — LIPID PANEL
Cholesterol: 181 mg/dL (ref 0–200)
HDL: 31.3 mg/dL — ABNORMAL LOW (ref >39.00)
LDL Cholesterol: 122 mg/dL — ABNORMAL HIGH (ref 0–99)
NonHDL: 150.09
Total CHOL/HDL Ratio: 6
Triglycerides: 138 mg/dL (ref 0.0–149.0)
VLDL: 27.6 mg/dL (ref 0.0–40.0)

## 2017-09-23 LAB — HEMOGLOBIN A1C: Hgb A1c MFr Bld: 5.9 % (ref 4.6–6.5)

## 2017-09-23 NOTE — Patient Instructions (Addendum)
Jorge Benton , Thank you for taking time to come for your Medicare Wellness Visit. I appreciate your ongoing commitment to your health goals. Please review the following plan we discussed and let me know if I can assist you in the future.   These are the goals we discussed: Goals    Starting 09/23/2017, I will attempt to drink at least 6-8 glasses of water daily. Starting 10/04/17,I will resume going to gym for 60 minutes 3 days per week.       This is a list of the screening recommended for you and due dates:  Health Maintenance  Topic Date Due  . Flu Shot  09/29/2017*  . Pneumonia vaccines (2 of 2 - PPSV23) 09/29/2017*  . Colon Cancer Screening  11/27/2018  . Tetanus Vaccine  09/13/2023  .  Hepatitis C: One time screening is recommended by Center for Disease Control  (CDC) for  adults born from [birth year] through 1965.   Completed  *Topic was postponed. The date shown is not the original due date.   Preventive Care for Adults  A healthy lifestyle and preventive care can promote health and wellness. Preventive health guidelines for adults include the following key practices.  . A routine yearly physical is a good way to check with your health care provider about your health and preventive screening. It is a chance to share any concerns and updates on your health and to receive a thorough exam.  . Visit your dentist for a routine exam and preventive care every 6 months. Brush your teeth twice a day and floss once a day. Good oral hygiene prevents tooth decay and gum disease.  . The frequency of eye exams is based on your age, health, family medical history, use  of contact lenses, and other factors. Follow your health care provider's recommendations for frequency of eye exams.  . Eat a healthy diet. Foods like vegetables, fruits, whole grains, low-fat dairy products, and lean protein foods contain the nutrients you need without too many calories. Decrease your intake of foods high in solid  fats, added sugars, and salt. Eat the right amount of calories for you. Get information about a proper diet from your health care provider, if necessary.  . Regular physical exercise is one of the most important things you can do for your health. Most adults should get at least 150 minutes of moderate-intensity exercise (any activity that increases your heart rate and causes you to sweat) each week. In addition, most adults need muscle-strengthening exercises on 2 or more days a week.  Silver Sneakers may be a benefit available to you. To determine eligibility, you may visit the website: www.silversneakers.com or contact program at 212-161-5707 Mon-Fri between 8AM-8PM.   . Maintain a healthy weight. The body mass index (BMI) is a screening tool to identify possible weight problems. It provides an estimate of body fat based on height and weight. Your health care provider can find your BMI and can help you achieve or maintain a healthy weight.   For adults 20 years and older: ? A BMI below 18.5 is considered underweight. ? A BMI of 18.5 to 24.9 is normal. ? A BMI of 25 to 29.9 is considered overweight. ? A BMI of 30 and above is considered obese.   . Maintain normal blood lipids and cholesterol levels by exercising and minimizing your intake of saturated fat. Eat a balanced diet with plenty of fruit and vegetables. Blood tests for lipids and cholesterol should begin at  age 41 and be repeated every 5 years. If your lipid or cholesterol levels are high, you are over 50, or you are at high risk for heart disease, you may need your cholesterol levels checked more frequently. Ongoing high lipid and cholesterol levels should be treated with medicines if diet and exercise are not working.  . If you smoke, find out from your health care provider how to quit. If you do not use tobacco, please do not start.  . If you choose to drink alcohol, please do not consume more than 2 drinks per day. One drink is  considered to be 12 ounces (355 mL) of beer, 5 ounces (148 mL) of wine, or 1.5 ounces (44 mL) of liquor.  . If you are 61-37 years old, ask your health care provider if you should take aspirin to prevent strokes.  . Use sunscreen. Apply sunscreen liberally and repeatedly throughout the day. You should seek shade when your shadow is shorter than you. Protect yourself by wearing long sleeves, pants, a wide-brimmed hat, and sunglasses year round, whenever you are outdoors.  . Once a month, do a whole body skin exam, using a mirror to look at the skin on your back. Tell your health care provider of new moles, moles that have irregular borders, moles that are larger than a pencil eraser, or moles that have changed in shape or color.

## 2017-09-23 NOTE — Progress Notes (Signed)
PCP notes:   Health maintenance:  Flu vaccine - pt travelling; requests vaccine at next appt PPSV23 - pt travelling; requests vaccine at next appt  Abnormal screenings:   Hearing - failed  Hearing Screening   125Hz  250Hz  500Hz  1000Hz  2000Hz  3000Hz  4000Hz  6000Hz  8000Hz   Right ear:   0 40 40  0    Left ear:   40 40 40  40    Comments: Hearing exam with [facility] approx. 8 mths ago; per pt, no hearing loss noted  Patient concerns:   None  Nurse concerns:  None  Next PCP appt:   09/29/17 @ 1415  I reviewed health advisor's note, was available for consultation on the day of service listed in this note, and agree with documentation and plan. Elsie Stain, MD.

## 2017-09-23 NOTE — Progress Notes (Signed)
Subjective:   Jorge Benton is a 66 y.o. male who presents for Medicare Annual/Subsequent preventive examination.  Review of Systems:  N/A Cardiac Risk Factors include: advanced age (>79men, >95 women);obesity (BMI >30kg/m2);male gender;dyslipidemia     Objective:    Vitals: BP 110/86 (BP Location: Right Arm, Patient Position: Sitting, Cuff Size: Large)   Pulse 78   Temp 98 F (36.7 C) (Oral)   Ht 5' 10.5" (1.791 m) Comment: no shoes  Wt 290 lb 8 oz (131.8 kg)   SpO2 95%   BMI 41.09 kg/m   Body mass index is 41.09 kg/m.  Tobacco Social History   Tobacco Use  Smoking Status Former Smoker  . Packs/day: 1.00  . Years: 45.00  . Pack years: 45.00  . Types: Cigarettes  Smokeless Tobacco Never Used     Counseling given: No   Past Medical History:  Diagnosis Date  . Environmental allergies   . Hyperlipidemia   . Melanoma ([place]) 06/2010   [facility]  . Sinusitis 1968   severe in Hospital   Past Surgical History:  Procedure Laterality Date  . APPENDECTOMY  1965  . HERNIA REPAIR  1990  . MOHS SURGERY     06/2010- melanoma L thigh   Family History  Problem Relation Age of Onset  . Uterine cancer Mother        hysterectomy 61, chemo and radiation  . Hypothyroidism Mother   . Hypertension Mother   . Heart disease Father        MI after surgery  . Stroke Maternal Grandfather   . Colon cancer Neg Hx   . Prostate cancer Neg Hx   . Pancreatic cancer Neg Hx   . Stomach cancer Neg Hx    Social History   Substance and Sexual Activity  Sexual Activity Not on file    Outpatient Encounter Medications as of 09/23/2017  Medication Sig  . cetirizine (ZYRTEC) 10 MG tablet Take 10 mg by mouth daily as needed.    . Glucosamine 500 MG CAPS Take by mouth daily.    Marland Kitchen ibuprofen (ADVIL,MOTRIN) 200 MG tablet Take 200 mg by mouth as needed.  . Multiple Vitamins-Minerals (CENTRUM SILVER PO) Take by mouth daily.    . [DISCONTINUED] acetic acid (VOSOL) 2 % otic  solution Place 4 drops into the right ear 3 (three) times daily.   No facility-administered encounter medications on file as of 09/23/2017.     Activities of Daily Living In your present state of health, do you have any difficulty performing the following activities: 09/23/2017  Hearing? N  Vision? N  Difficulty concentrating or making decisions? N  Walking or climbing stairs? N  Dressing or bathing? N  Doing errands, shopping? N  Preparing Food and eating ? N  Using the Toilet? N  In the past six months, have you accidently leaked urine? N  Do you have problems with loss of bowel control? N  Managing your Medications? N  Managing your Finances? N  Housekeeping or managing your Housekeeping? N  Some recent data might be hidden    Patient Care Team: Tonia Ghent, MD as PCP - General (Family Medicine) Conception Chancy, DDS as Referring Physician (Dentistry) Eula Flax, OD as Referring Physician (Optometry)   Assessment:     Hearing Screening   125Hz  250Hz  500Hz  1000Hz  2000Hz  3000Hz  4000Hz  6000Hz  8000Hz   Right ear:   0 40 40  0    Left ear:   40 40  40  40    Comments: Hearing exam with [facility] approx. 8 mths ago; no concerns verbalized to patient at exam   Vision Screening Comments: Last vision exam in Nov 2017 with Dr. Glennon Mac   Exercise Activities and Dietary recommendations Current Exercise Habits: The patient does not participate in regular exercise at present, Exercise limited by: None identified  Goals    Starting 09/23/2017, I will attempt to drink at least 6-8 glasses of water daily. Starting 10/04/17,I will resume going to gym for 60 minutes 3 days per week.      Fall Risk Fall Risk  09/23/2017 09/22/2016  Falls in the past year? No No   Depression Screen PHQ 2/9 Scores 09/23/2017 09/22/2016  PHQ - 2 Score 0 0  PHQ- 9 Score 0 -    Cognitive Function MMSE - Mini Mental State Exam 09/23/2017  Orientation to time 5  Orientation to Place 5  Registration 3    Attention/ Calculation 0  Recall 3  Language- name 2 objects 0  Language- repeat 1  Language- follow 3 step command 3  Language- read & follow direction 0  Write a sentence 0  Copy design 0  Total score 20       PLEASE NOTE: A Mini-Cog screen was completed. Maximum score is 20. A value of 0 denotes this part of Folstein MMSE was not completed or the patient failed this part of the Mini-Cog screening.   Mini-Cog Screening Orientation to Time - Max 5 pts Orientation to Place - Max 5 pts Registration - Max 3 pts Recall - Max 3 pts Language Repeat - Max 1 pts Language Follow 3 Step Command - Max 3 pts   Immunization History  Administered Date(s) Administered  . Influenza,inj,Quad PF,6+ Mos 09/12/2013, 09/13/2014, 09/16/2015, 09/22/2016  . Pneumococcal Conjugate-13 09/22/2016  . Td 07/24/2003  . Tdap 09/12/2013  . Zoster 05/02/2012   Screening Tests Health Maintenance  Topic Date Due  . INFLUENZA VACCINE  09/29/2017 (Originally 06/16/2017)  . PNA vac Low Risk Adult (2 of 2 - PPSV23) 09/29/2017 (Originally 09/22/2017)  . COLONOSCOPY  11/27/2018  . TETANUS/TDAP  09/13/2023  . Hepatitis C Screening  Completed      Plan:     I have personally reviewed, addressed, and noted the following in the patient's chart:  A. Medical and social history B. Use of alcohol, tobacco or illicit drugs  C. Current medications and supplements D. Functional ability and status E.  Nutritional status F.  Physical activity G. Advance directives H. List of other physicians I.  Hospitalizations, surgeries, and ER visits in previous 12 months J.  [place] to include hearing, vision, cognitive, depression L. Referrals and appointments - none  In addition, I have reviewed and discussed with patient certain preventive protocols, quality metrics, and best practice recommendations. A written personalized care plan for preventive services as well as general preventive health recommendations  were provided to patient.  See attached scanned questionnaire for additional information.   Signed,   Lindell Noe, MHA, BS, LPN Health Coach

## 2017-09-23 NOTE — Progress Notes (Signed)
Pre visit review using our clinic review tool, if applicable. No additional management support is needed unless otherwise documented below in the visit note. 

## 2017-09-29 ENCOUNTER — Encounter: Payer: Medicare Other | Admitting: Family Medicine

## 2017-10-01 ENCOUNTER — Ambulatory Visit (INDEPENDENT_AMBULATORY_CARE_PROVIDER_SITE_OTHER): Payer: Medicare Other | Admitting: Family Medicine

## 2017-10-01 ENCOUNTER — Encounter: Payer: Self-pay | Admitting: Family Medicine

## 2017-10-01 VITALS — BP 110/86 | HR 78 | Temp 98.0°F | Ht 70.5 in | Wt 290.5 lb

## 2017-10-01 DIAGNOSIS — Z23 Encounter for immunization: Secondary | ICD-10-CM

## 2017-10-01 DIAGNOSIS — Z7189 Other specified counseling: Secondary | ICD-10-CM

## 2017-10-01 DIAGNOSIS — E785 Hyperlipidemia, unspecified: Secondary | ICD-10-CM | POA: Diagnosis not present

## 2017-10-01 DIAGNOSIS — C439 Malignant melanoma of skin, unspecified: Secondary | ICD-10-CM

## 2017-10-01 DIAGNOSIS — R739 Hyperglycemia, unspecified: Secondary | ICD-10-CM

## 2017-10-01 DIAGNOSIS — Z0000 Encounter for general adult medical examination without abnormal findings: Secondary | ICD-10-CM

## 2017-10-01 NOTE — Progress Notes (Signed)
Health maintenance: Flu vaccine - 2018 PPSV23 - 2018 Hearing - failed.  Declined hearing aids.  He thought his sx were related congestion related and some better in the meantime.   Colonoscopy 2015 Prostate cancer screening and PSA options (with potential risks and benefits of testing vs not testing) were discussed along with recent recs/guidelines.  He declined testing PSA at this point. AAA screen prev done.   Advance directive-wife designated if patient were incapacitated.  Lung cancer screening program d/w pt.  >30 pack year hx.  Quit about ~2011.  Encouraged screening, he'll consider.    H/o hyperglycemia.  Labs d/w pt. see below about diet and exercise.  HLD.  13% ASCVD score.  D/w pt about statin use and primary prevention along with D&E.  Labs d/w pt.  He hasn't been exercising as much since his wife for surgery.  PMH and SH reviewed  ROS: Per HPI unless specifically indicated in ROS section   Meds, vitals, and allergies reviewed.   GEN: nad, alert and oriented HEENT: mucous membranes moist NECK: supple w/o LA CV: rrr.  PULM: ctab, no inc wob ABD: soft, +bs EXT: no edema SKIN: no acute rash

## 2017-10-01 NOTE — Patient Instructions (Signed)
Update me as needed.  Try to work on diet and exercise.   Glad to see you.  Let me know if you want to go through with the lung cancer screening program or statin treatment.

## 2017-10-03 DIAGNOSIS — Z0000 Encounter for general adult medical examination without abnormal findings: Secondary | ICD-10-CM | POA: Insufficient documentation

## 2017-10-03 NOTE — Assessment & Plan Note (Signed)
Advance directive- wife designated if patient were incapacitated.  

## 2017-10-03 NOTE — Assessment & Plan Note (Signed)
H/o, followed by dermatology.

## 2017-10-03 NOTE — Assessment & Plan Note (Addendum)
13% ASCVD score now.  D/w pt about statin use and primary prevention along with D&E.  Labs d/w pt.  He hasn't been exercising as much since his wife for surgery.  He declines statin, in spite of mortality benefit.  He wants to work on diet and exercise.  We can recheck episodically. >25 minutes spent in face to face time with patient, >50% spent in counselling or coordination of care.

## 2017-10-03 NOTE — Assessment & Plan Note (Signed)
Flu vaccine - 2018 PPSV23 - 2018 Hearing - failed.  Declined hearing aids.  He thought his sx were related congestion related and some better in the meantime.   Colonoscopy 2015 Prostate cancer screening and PSA options (with potential risks and benefits of testing vs not testing) were discussed along with recent recs/guidelines.  He declined testing PSA at this point. AAA screen prev done.   Advance directive-wife designated if patient were incapacitated.  Lung cancer screening program d/w pt.  >30 pack year hx.  Quit about ~2011.  Encouraged screening, he'll consider.

## 2017-10-03 NOTE — Assessment & Plan Note (Signed)
Labs d/w pt. see below about diet and exercise.

## 2017-10-05 DIAGNOSIS — H2513 Age-related nuclear cataract, bilateral: Secondary | ICD-10-CM | POA: Diagnosis not present

## 2017-11-23 DIAGNOSIS — L820 Inflamed seborrheic keratosis: Secondary | ICD-10-CM | POA: Diagnosis not present

## 2017-11-23 DIAGNOSIS — L538 Other specified erythematous conditions: Secondary | ICD-10-CM | POA: Diagnosis not present

## 2017-11-23 DIAGNOSIS — D485 Neoplasm of uncertain behavior of skin: Secondary | ICD-10-CM | POA: Diagnosis not present

## 2017-11-23 DIAGNOSIS — B078 Other viral warts: Secondary | ICD-10-CM | POA: Diagnosis not present

## 2018-04-05 DIAGNOSIS — Z85820 Personal history of malignant melanoma of skin: Secondary | ICD-10-CM | POA: Diagnosis not present

## 2018-04-05 DIAGNOSIS — L570 Actinic keratosis: Secondary | ICD-10-CM | POA: Diagnosis not present

## 2018-04-05 DIAGNOSIS — D485 Neoplasm of uncertain behavior of skin: Secondary | ICD-10-CM | POA: Diagnosis not present

## 2018-04-05 DIAGNOSIS — L820 Inflamed seborrheic keratosis: Secondary | ICD-10-CM | POA: Diagnosis not present

## 2018-04-05 DIAGNOSIS — X32XXXA Exposure to sunlight, initial encounter: Secondary | ICD-10-CM | POA: Diagnosis not present

## 2018-04-05 DIAGNOSIS — C44519 Basal cell carcinoma of skin of other part of trunk: Secondary | ICD-10-CM | POA: Diagnosis not present

## 2018-04-05 DIAGNOSIS — L538 Other specified erythematous conditions: Secondary | ICD-10-CM | POA: Diagnosis not present

## 2018-04-05 DIAGNOSIS — Z85828 Personal history of other malignant neoplasm of skin: Secondary | ICD-10-CM | POA: Diagnosis not present

## 2018-04-05 DIAGNOSIS — C44719 Basal cell carcinoma of skin of left lower limb, including hip: Secondary | ICD-10-CM | POA: Diagnosis not present

## 2018-04-05 DIAGNOSIS — Z08 Encounter for follow-up examination after completed treatment for malignant neoplasm: Secondary | ICD-10-CM | POA: Diagnosis not present

## 2018-04-05 DIAGNOSIS — L298 Other pruritus: Secondary | ICD-10-CM | POA: Diagnosis not present

## 2018-04-20 DIAGNOSIS — C44519 Basal cell carcinoma of skin of other part of trunk: Secondary | ICD-10-CM | POA: Diagnosis not present

## 2018-04-20 DIAGNOSIS — C44719 Basal cell carcinoma of skin of left lower limb, including hip: Secondary | ICD-10-CM | POA: Diagnosis not present

## 2018-05-02 DIAGNOSIS — T8140XA Infection following a procedure, unspecified, initial encounter: Secondary | ICD-10-CM | POA: Diagnosis not present

## 2018-08-24 DIAGNOSIS — Z85828 Personal history of other malignant neoplasm of skin: Secondary | ICD-10-CM | POA: Diagnosis not present

## 2018-08-24 DIAGNOSIS — L570 Actinic keratosis: Secondary | ICD-10-CM | POA: Diagnosis not present

## 2018-08-24 DIAGNOSIS — D2272 Melanocytic nevi of left lower limb, including hip: Secondary | ICD-10-CM | POA: Diagnosis not present

## 2018-08-24 DIAGNOSIS — X32XXXA Exposure to sunlight, initial encounter: Secondary | ICD-10-CM | POA: Diagnosis not present

## 2018-08-24 DIAGNOSIS — L821 Other seborrheic keratosis: Secondary | ICD-10-CM | POA: Diagnosis not present

## 2018-08-24 DIAGNOSIS — B078 Other viral warts: Secondary | ICD-10-CM | POA: Diagnosis not present

## 2018-08-24 DIAGNOSIS — Z85820 Personal history of malignant melanoma of skin: Secondary | ICD-10-CM | POA: Diagnosis not present

## 2018-08-24 DIAGNOSIS — D2261 Melanocytic nevi of right upper limb, including shoulder: Secondary | ICD-10-CM | POA: Diagnosis not present

## 2018-08-24 DIAGNOSIS — L538 Other specified erythematous conditions: Secondary | ICD-10-CM | POA: Diagnosis not present

## 2018-09-25 ENCOUNTER — Other Ambulatory Visit: Payer: Self-pay | Admitting: Family Medicine

## 2018-09-25 DIAGNOSIS — R739 Hyperglycemia, unspecified: Secondary | ICD-10-CM

## 2018-09-25 DIAGNOSIS — E785 Hyperlipidemia, unspecified: Secondary | ICD-10-CM

## 2018-09-26 ENCOUNTER — Ambulatory Visit (INDEPENDENT_AMBULATORY_CARE_PROVIDER_SITE_OTHER): Payer: Medicare Other

## 2018-09-26 ENCOUNTER — Ambulatory Visit: Payer: Medicare Other

## 2018-09-26 VITALS — BP 124/82 | HR 80 | Temp 97.5°F | Ht 71.0 in | Wt 294.8 lb

## 2018-09-26 DIAGNOSIS — Z0000 Encounter for general adult medical examination without abnormal findings: Secondary | ICD-10-CM

## 2018-09-26 DIAGNOSIS — R739 Hyperglycemia, unspecified: Secondary | ICD-10-CM | POA: Diagnosis not present

## 2018-09-26 DIAGNOSIS — E785 Hyperlipidemia, unspecified: Secondary | ICD-10-CM

## 2018-09-26 DIAGNOSIS — Z23 Encounter for immunization: Secondary | ICD-10-CM | POA: Diagnosis not present

## 2018-09-26 LAB — LIPID PANEL
Cholesterol: 167 mg/dL (ref 0–200)
HDL: 33.9 mg/dL — ABNORMAL LOW (ref >39.00)
LDL Cholesterol: 113 mg/dL — ABNORMAL HIGH (ref 0–99)
NonHDL: 132.73
Total CHOL/HDL Ratio: 5
Triglycerides: 99 mg/dL (ref 0.0–149.0)
VLDL: 19.8 mg/dL (ref 0.0–40.0)

## 2018-09-26 LAB — COMPREHENSIVE METABOLIC PANEL
ALT: 24 U/L (ref 0–53)
AST: 23 U/L (ref 0–37)
Albumin: 4.1 g/dL (ref 3.5–5.2)
Alkaline Phosphatase: 67 U/L (ref 39–117)
BUN: 16 mg/dL (ref 6–23)
CO2: 28 mEq/L (ref 19–32)
Calcium: 9.4 mg/dL (ref 8.4–10.5)
Chloride: 105 mEq/L (ref 96–112)
Creatinine, Ser: 1 mg/dL (ref 0.40–1.50)
GFR: 79.21 mL/min (ref >60.00)
Glucose, Bld: 103 mg/dL — ABNORMAL HIGH (ref 70–99)
Potassium: 4.5 mEq/L (ref 3.5–5.1)
Sodium: 140 mEq/L (ref 135–145)
Total Bilirubin: 0.5 mg/dL (ref 0.2–1.2)
Total Protein: 7 g/dL (ref 6.0–8.3)

## 2018-09-26 LAB — HEMOGLOBIN A1C: Hgb A1c MFr Bld: 5.9 % (ref 4.6–6.5)

## 2018-09-26 NOTE — Progress Notes (Signed)
Subjective:   Jorge Benton is a 67 y.o. male who presents for Medicare Annual/Subsequent preventive examination.  Review of Systems:  N/A Cardiac Risk Factors include: advanced age (>24men, >61 women);obesity (BMI >30kg/m2);male gender;dyslipidemia     Objective:    Vitals: BP 124/82 (BP Location: Right Arm, Patient Position: Sitting, Cuff Size: Large)   Pulse 80   Temp (!) 97.5 F (36.4 C) (Oral)   Ht 5\' 11"  (1.803 m)   Wt 294 lb 12 oz (133.7 kg)   SpO2 96%   BMI 41.11 kg/m   Body mass index is 41.11 kg/m.  Advanced Directives 09/26/2018 09/23/2017  Does Patient Have a Medical Advance Directive? No Yes  Type of Advance Directive - [place];Living will  Copy of [place] in Chart? - No - copy requested  Would patient like information on creating a medical advance directive? Yes (MAU/Ambulatory/Procedural Areas - Information given) -    Tobacco Social History   Tobacco Use  Smoking Status Former Smoker  . Packs/day: 1.00  . Years: 45.00  . Pack years: 45.00  . Types: Cigarettes  Smokeless Tobacco Never Used     Counseling given: No   Clinical Intake:  Pre-visit preparation completed: Yes  Pain : No/denies pain Pain Score: 0-No pain     Nutritional Status: BMI > 30  Obese Nutritional Risks: None Diabetes: No  How often do you need to have someone help you when you read instructions, pamphlets, or other written materials from your doctor or pharmacy?: 1 - Never What is the last grade level you completed in school?: Bachelor degree  Interpreter Needed?: No  Comments: pt lives with spouse Information entered by :: LPinson, LPN  Past Medical History:  Diagnosis Date  . Environmental allergies   . Hyperlipidemia   . Melanoma ([place]) 06/2010   [facility], [facility]  . Sinusitis 1968   severe in Hospital   Past Surgical History:  Procedure Laterality Date  . APPENDECTOMY  1965  . HERNIA REPAIR  1990  . MOHS  SURGERY     06/2010- melanoma L thigh   Family History  Problem Relation Age of Onset  . Uterine cancer Mother        hysterectomy 82, chemo and radiation  . Hypothyroidism Mother   . Hypertension Mother   . Heart disease Father        MI after surgery  . Stroke Maternal Grandfather   . Colon cancer Neg Hx   . Prostate cancer Neg Hx   . Pancreatic cancer Neg Hx   . Stomach cancer Neg Hx    Social History   Socioeconomic History  . Marital status: Married    Spouse name: Not on file  . Number of children: 3  . Years of education: Not on file  . Highest education level: Not on file  Occupational History  . Occupation: Paediatric nurse  Social Needs  . Financial resource strain: Not on file  . Food insecurity:    Worry: Not on file    Inability: Not on file  . Transportation needs:    Medical: Not on file    Non-medical: Not on file  Tobacco Use  . Smoking status: Former Smoker    Packs/day: 1.00    Years: 45.00    Pack years: 45.00    Types: Cigarettes  . Smokeless tobacco: Never Used  Substance and Sexual Activity  . Alcohol use: Yes    Alcohol/week: 1.0 standard  drinks    Types: 1 Standard drinks or equivalent per week    Comment: cocktail  . Drug use: No  . Sexual activity: Not on file  Lifestyle  . Physical activity:    Days per week: Not on file    Minutes per session: Not on file  . Stress: Not on file  Relationships  . Social connections:    Talks on phone: Not on file    Gets together: Not on file    Attends religious service: Not on file    Active member of club or organization: Not on file    Attends meetings of clubs or organizations: Not on file    Relationship status: Not on file  Other Topics Concern  . Not on file  Social History Narrative   Jorge Benton   Married 1975   3 kids    Outpatient Encounter Medications as of 09/26/2018  Medication Sig  . cetirizine (ZYRTEC) 10 MG tablet Take 10 mg by mouth daily as needed.    . Glucosamine  500 MG CAPS Take by mouth daily.    Nyoka Cowden Tea, Camellia sinensis, (GREEN TEA EXTRACT PO) Take 2 capsules by mouth daily.  Marland Kitchen ibuprofen (ADVIL,MOTRIN) 200 MG tablet Take 200 mg by mouth as needed.  . Multiple Vitamins-Minerals (CENTRUM SILVER PO) Take by mouth daily.    . Pyridoxine HCl (B-6 PO) Take 1 capsule by mouth daily.   No facility-administered encounter medications on file as of 09/26/2018.     Activities of Daily Living In your present state of health, do you have any difficulty performing the following activities: 09/26/2018  Hearing? N  Vision? N  Difficulty concentrating or making decisions? N  Walking or climbing stairs? N  Dressing or bathing? N  Doing errands, shopping? N  Preparing Food and eating ? N  Using the Toilet? N  In the past six months, have you accidently leaked urine? N  Do you have problems with loss of bowel control? N  Managing your Medications? N  Managing your Finances? N  Housekeeping or managing your Housekeeping? N  Some recent data might be hidden    Patient Care Team: Tonia Ghent, MD as PCP - General (Family Medicine) Conception Chancy, DDS as Referring Physician (Dentistry) Eula Flax, OD as Referring Physician (Optometry)   Assessment:   This is a routine wellness examination for Jorge Benton.  Exercise Activities and Dietary recommendations Current Exercise Habits: Home exercise routine, Type of exercise: walking;Other - see comments(stationary bike, golf), Time (Minutes): > 60, Frequency (Times/Week): 7, Weekly Exercise (Minutes/Week): 0, Intensity: Moderate, Exercise limited by: None identified  Goals    . Increase physical activity     Starting 09/26/2018, I will continue to walk for 30 minutes 2-3 times per day and to ride stationary bike for 30 minutes twice weekly.        Fall Risk Fall Risk  09/26/2018 09/23/2017 09/22/2016  Falls in the past year? 0 No No   Depression Screen PHQ 2/9 Scores 09/26/2018 09/23/2017 09/22/2016    PHQ - 2 Score 0 0 0  PHQ- 9 Score 0 0 -    Cognitive Function MMSE - Mini Mental State Exam 09/26/2018 09/23/2017  Orientation to time 5 5  Orientation to Place 5 5  Registration 3 3  Attention/ Calculation 0 0  Recall 3 3  Language- name 2 objects 0 0  Language- repeat 1 1  Language- follow 3 step command 3 3  Language- read &  follow direction 0 0  Write a sentence 0 0  Copy design 0 0  Total score 20 20        Immunization History  Administered Date(s) Administered  . Influenza,inj,Quad PF,6+ Mos 09/12/2013, 09/13/2014, 09/16/2015, 09/22/2016, 10/01/2017, 09/26/2018  . Pneumococcal Conjugate-13 09/22/2016  . Pneumococcal Polysaccharide-23 10/01/2017  . Td 07/24/2003  . Tdap 09/12/2013  . Zoster 05/02/2012    Screening Tests Health Maintenance  Topic Date Due  . COLONOSCOPY  11/27/2018  . TETANUS/TDAP  09/13/2023  . INFLUENZA VACCINE  Completed  . Hepatitis C Screening  Completed  . PNA vac Low Risk Adult  Completed   Plan:     I have personally reviewed, addressed, and noted the following in the patient's chart:  A. Medical and social history B. Use of alcohol, tobacco or illicit drugs  C. Current medications and supplements D. Functional ability and status E.  Nutritional status F.  Physical activity G. Advance directives H. List of other physicians I.  Hospitalizations, surgeries, and ER visits in previous 12 months J.  [place] to include hearing, vision, cognitive, depression L. Referrals and appointments - none  In addition, I have reviewed and discussed with patient certain preventive protocols, quality metrics, and best practice recommendations. A written personalized care plan for preventive services as well as general preventive health recommendations were provided to patient.  See attached scanned questionnaire for additional information.   Signed,   Lindell Noe, MHA, BS, LPN Health Coach

## 2018-09-26 NOTE — Progress Notes (Signed)
PCP notes:   Health maintenance:  Flu vaccine - administered  Abnormal screenings:   None  Patient concerns:   None  Nurse concerns:  None  Next PCP appt:   10/03/18 @ 0945  I reviewed health advisor's note, was available for consultation on the day of service listed in this note, and agree with documentation and plan. Elsie Stain, MD.

## 2018-09-26 NOTE — Patient Instructions (Signed)
Jorge Benton , Thank you for taking time to come for your Medicare Wellness Visit. I appreciate your ongoing commitment to your health goals. Please review the following plan we discussed and let me know if I can assist you in the future.   These are the goals we discussed: Goals    . Increase physical activity     Starting 09/26/2018, I will continue to walk for 30 minutes 2-3 times per day and to ride stationary bike for 30 minutes twice weekly.        This is a list of the screening recommended for you and due dates:  Health Maintenance  Topic Date Due  . Colon Cancer Screening  11/27/2018  . Tetanus Vaccine  09/13/2023  . Flu Shot  Completed  .  Hepatitis C: One time screening is recommended by Center for Disease Control  (CDC) for  adults born from [birth year] through 1965.   Completed  . Pneumonia vaccines  Completed   Preventive Care for Adults  A healthy lifestyle and preventive care can promote health and wellness. Preventive health guidelines for adults include the following key practices.  . A routine yearly physical is a good way to check with your health care provider about your health and preventive screening. It is a chance to share any concerns and updates on your health and to receive a thorough exam.  . Visit your dentist for a routine exam and preventive care every 6 months. Brush your teeth twice a day and floss once a day. Good oral hygiene prevents tooth decay and gum disease.  . The frequency of eye exams is based on your age, health, family medical history, use  of contact lenses, and other factors. Follow your health care provider's recommendations for frequency of eye exams.  . Eat a healthy diet. Foods like vegetables, fruits, whole grains, low-fat dairy products, and lean protein foods contain the nutrients you need without too many calories. Decrease your intake of foods high in solid fats, added sugars, and salt. Eat the right amount of calories for you. Get  information about a proper diet from your health care provider, if necessary.  . Regular physical exercise is one of the most important things you can do for your health. Most adults should get at least 150 minutes of moderate-intensity exercise (any activity that increases your heart rate and causes you to sweat) each week. In addition, most adults need muscle-strengthening exercises on 2 or more days a week.  Silver Sneakers may be a benefit available to you. To determine eligibility, you may visit the website: www.silversneakers.com or contact program at (747)590-5049 Mon-Fri between 8AM-8PM.   . Maintain a healthy weight. The body mass index (BMI) is a screening tool to identify possible weight problems. It provides an estimate of body fat based on height and weight. Your health care provider can find your BMI and can help you achieve or maintain a healthy weight.   For adults 20 years and older: ? A BMI below 18.5 is considered underweight. ? A BMI of 18.5 to 24.9 is normal. ? A BMI of 25 to 29.9 is considered overweight. ? A BMI of 30 and above is considered obese.   . Maintain normal blood lipids and cholesterol levels by exercising and minimizing your intake of saturated fat. Eat a balanced diet with plenty of fruit and vegetables. Blood tests for lipids and cholesterol should begin at age 42 and be repeated every 5 years. If your lipid or  cholesterol levels are high, you are over 50, or you are at high risk for heart disease, you may need your cholesterol levels checked more frequently. Ongoing high lipid and cholesterol levels should be treated with medicines if diet and exercise are not working.  . If you smoke, find out from your health care provider how to quit. If you do not use tobacco, please do not start.  . If you choose to drink alcohol, please do not consume more than 2 drinks per day. One drink is considered to be 12 ounces (355 mL) of beer, 5 ounces (148 mL) of wine, or 1.5  ounces (44 mL) of liquor.  . If you are 3-68 years old, ask your health care provider if you should take aspirin to prevent strokes.  . Use sunscreen. Apply sunscreen liberally and repeatedly throughout the day. You should seek shade when your shadow is shorter than you. Protect yourself by wearing long sleeves, pants, a wide-brimmed hat, and sunglasses year round, whenever you are outdoors.  . Once a month, do a whole body skin exam, using a mirror to look at the skin on your back. Tell your health care provider of new moles, moles that have irregular borders, moles that are larger than a pencil eraser, or moles that have changed in shape or color.

## 2018-10-03 ENCOUNTER — Ambulatory Visit (INDEPENDENT_AMBULATORY_CARE_PROVIDER_SITE_OTHER): Payer: Medicare Other | Admitting: Family Medicine

## 2018-10-03 ENCOUNTER — Encounter: Payer: Self-pay | Admitting: Family Medicine

## 2018-10-03 VITALS — BP 124/82 | HR 80 | Temp 97.5°F | Ht 71.0 in | Wt 294.8 lb

## 2018-10-03 DIAGNOSIS — C439 Malignant melanoma of skin, unspecified: Secondary | ICD-10-CM

## 2018-10-03 DIAGNOSIS — Z0000 Encounter for general adult medical examination without abnormal findings: Secondary | ICD-10-CM

## 2018-10-03 DIAGNOSIS — R739 Hyperglycemia, unspecified: Secondary | ICD-10-CM | POA: Diagnosis not present

## 2018-10-03 DIAGNOSIS — Z7189 Other specified counseling: Secondary | ICD-10-CM

## 2018-10-03 NOTE — Patient Instructions (Addendum)
Call the GI clinic in 11/2018 if the don't call you first.  Keep working on diet and exercise.   Call your insurance see if they cover lung cancer screening.  Let me know if you need a referral.   Please get me a copy of your outside labs.   Take care.  Glad to see you.  Thanks for getting a flu shot.

## 2018-10-03 NOTE — Progress Notes (Signed)
He was asking about HCG tx for weight loss. I encouraged him not to use HCG tx.    He is taking Diethylpropion per bariatric clinic.  D/w pt.  Recent start.  No ADE on med.  Discussed walking for exercise.  He has an exercise bike at home.  Discussed diet, he has a low carb diet handout.    Flu vaccine -2019 PNA up to date Tdap 2014 zostavax 2013 Colonoscopy 2015 Prostate cancer screening and PSA options(with potential risks and benefits of testing vs not testing) were discussed along with recent recs/guidelines. He declined testing PSAat this point. AAA screen prev done.   Advance directive-wife designated if patient were incapacitated. Lung cancer screening program d/w pt.  >30 pack year hx.  Quit about ~2011.  Encouraged screening, he'll consider.    Hyperglycemia d/w pt. discussed diet and exercise.  Discussed labs.  See above.  He is still seeing dermatology.  History of melanoma.  No new or worrisome lesions recently noted.

## 2018-10-04 NOTE — Assessment & Plan Note (Signed)
Flu vaccine -2019 PNA up to date Tdap 2014 zostavax 2013 Colonoscopy 2015 Prostate cancer screening and PSA options(with potential risks and benefits of testing vs not testing) were discussed along with recent recs/guidelines. He declined testing PSAat this point. AAA screen prev done.   Advance directive-wife designated if patient were incapacitated. Lung cancer screening program d/w pt.  >30 pack year hx.  Quit about ~2011.  Encouraged screening, he'll consider.

## 2018-10-04 NOTE — Assessment & Plan Note (Signed)
Advance directive- wife designated if patient were incapacitated.  

## 2018-10-04 NOTE — Assessment & Plan Note (Signed)
discussed diet and exercise.  Discussed labs.  See above. He is on diethylpropion per bariatric clinic.  Recent start on medication.  No adverse effect noted.  Discussed. I advised him not to use hCG for weight loss. We discussed all that in detail. I encouraged him to work on diet and exercise.  He will update me as needed. >25 minutes spent in face to face time with patient, >50% spent in counselling or coordination of care.

## 2018-10-04 NOTE — Assessment & Plan Note (Signed)
History of, per dermatology.  I will defer.

## 2019-01-14 ENCOUNTER — Encounter: Payer: Self-pay | Admitting: Gastroenterology

## 2019-04-25 DIAGNOSIS — X32XXXA Exposure to sunlight, initial encounter: Secondary | ICD-10-CM | POA: Diagnosis not present

## 2019-04-25 DIAGNOSIS — Z85820 Personal history of malignant melanoma of skin: Secondary | ICD-10-CM | POA: Diagnosis not present

## 2019-04-25 DIAGNOSIS — C44712 Basal cell carcinoma of skin of right lower limb, including hip: Secondary | ICD-10-CM | POA: Diagnosis not present

## 2019-04-25 DIAGNOSIS — D485 Neoplasm of uncertain behavior of skin: Secondary | ICD-10-CM | POA: Diagnosis not present

## 2019-04-25 DIAGNOSIS — Z85828 Personal history of other malignant neoplasm of skin: Secondary | ICD-10-CM | POA: Diagnosis not present

## 2019-04-25 DIAGNOSIS — Z08 Encounter for follow-up examination after completed treatment for malignant neoplasm: Secondary | ICD-10-CM | POA: Diagnosis not present

## 2019-04-25 DIAGNOSIS — L570 Actinic keratosis: Secondary | ICD-10-CM | POA: Diagnosis not present

## 2019-04-25 DIAGNOSIS — R208 Other disturbances of skin sensation: Secondary | ICD-10-CM | POA: Diagnosis not present

## 2019-05-22 DIAGNOSIS — C44712 Basal cell carcinoma of skin of right lower limb, including hip: Secondary | ICD-10-CM | POA: Diagnosis not present

## 2019-07-31 DIAGNOSIS — L728 Other follicular cysts of the skin and subcutaneous tissue: Secondary | ICD-10-CM | POA: Diagnosis not present

## 2019-08-03 ENCOUNTER — Telehealth: Payer: Self-pay | Admitting: Family Medicine

## 2019-08-03 NOTE — Telephone Encounter (Signed)
Patient called in regards to getting a note to return to the Gym since it had opened/  He would like to know if this is something that you could do for him   Please advise

## 2019-08-04 NOTE — Telephone Encounter (Signed)
The gym is the [facility]; [facility] for [facility]' and uses the various weight machines for another 30'. Since hx of tendonitis Y has a special leg machine for legs also. The [employer] protocol is that without a letter from provider no one can go into the [facility] at this time. [facility] presently will admit members for exercise on machines if has letter from their doctor stating it is needed for pt to exercise due to pts health condition. Pt request cb after Dr Damita Dunnings reviews note.

## 2019-08-04 NOTE — Telephone Encounter (Signed)
I need more information.  What gym is he trying to go to?  What is he trying to do while there?  What is their protocol?  Thanks.

## 2019-08-04 NOTE — Telephone Encounter (Addendum)
Left detailed message on voicemail to return call with information or MyChart. DPR

## 2019-08-06 NOTE — Telephone Encounter (Signed)
I did the letter for the patient but he needs to understand the conditions on this letter.  He has a medical reason to exercise.  I would still advise him to wear a mask and try to find any way possible to exercise other than indoors at a gym.  If he cannot find any other way to exercise and if he is willing to accept the risk of exercising in a gym, then proceed.  Thanks.

## 2019-08-07 NOTE — Telephone Encounter (Signed)
Cell phone voicemail is full and cannot accept any messages.  Left detailed message on voicemail of work number who was identified as his name.

## 2019-10-02 DIAGNOSIS — X32XXXA Exposure to sunlight, initial encounter: Secondary | ICD-10-CM | POA: Diagnosis not present

## 2019-10-02 DIAGNOSIS — D2261 Melanocytic nevi of right upper limb, including shoulder: Secondary | ICD-10-CM | POA: Diagnosis not present

## 2019-10-02 DIAGNOSIS — R208 Other disturbances of skin sensation: Secondary | ICD-10-CM | POA: Diagnosis not present

## 2019-10-02 DIAGNOSIS — Z85828 Personal history of other malignant neoplasm of skin: Secondary | ICD-10-CM | POA: Diagnosis not present

## 2019-10-02 DIAGNOSIS — R58 Hemorrhage, not elsewhere classified: Secondary | ICD-10-CM | POA: Diagnosis not present

## 2019-10-02 DIAGNOSIS — Z85820 Personal history of malignant melanoma of skin: Secondary | ICD-10-CM | POA: Diagnosis not present

## 2019-10-02 DIAGNOSIS — D2271 Melanocytic nevi of right lower limb, including hip: Secondary | ICD-10-CM | POA: Diagnosis not present

## 2019-10-02 DIAGNOSIS — L570 Actinic keratosis: Secondary | ICD-10-CM | POA: Diagnosis not present

## 2019-10-02 DIAGNOSIS — D485 Neoplasm of uncertain behavior of skin: Secondary | ICD-10-CM | POA: Diagnosis not present

## 2019-10-05 ENCOUNTER — Other Ambulatory Visit: Payer: Self-pay | Admitting: Family Medicine

## 2019-10-05 DIAGNOSIS — R739 Hyperglycemia, unspecified: Secondary | ICD-10-CM

## 2019-10-05 DIAGNOSIS — E785 Hyperlipidemia, unspecified: Secondary | ICD-10-CM

## 2019-10-06 ENCOUNTER — Ambulatory Visit (INDEPENDENT_AMBULATORY_CARE_PROVIDER_SITE_OTHER): Payer: Medicare Other

## 2019-10-06 ENCOUNTER — Ambulatory Visit: Payer: Medicare Other

## 2019-10-06 ENCOUNTER — Other Ambulatory Visit: Payer: Self-pay

## 2019-10-06 ENCOUNTER — Other Ambulatory Visit (INDEPENDENT_AMBULATORY_CARE_PROVIDER_SITE_OTHER): Payer: Medicare Other

## 2019-10-06 DIAGNOSIS — E785 Hyperlipidemia, unspecified: Secondary | ICD-10-CM | POA: Diagnosis not present

## 2019-10-06 DIAGNOSIS — Z0000 Encounter for general adult medical examination without abnormal findings: Secondary | ICD-10-CM

## 2019-10-06 DIAGNOSIS — R739 Hyperglycemia, unspecified: Secondary | ICD-10-CM

## 2019-10-06 LAB — LIPID PANEL
Cholesterol: 169 mg/dL (ref 0–200)
HDL: 34.5 mg/dL — ABNORMAL LOW (ref >39.00)
LDL Cholesterol: 120 mg/dL — ABNORMAL HIGH (ref 0–99)
NonHDL: 134
Total CHOL/HDL Ratio: 5
Triglycerides: 70 mg/dL (ref 0.0–149.0)
VLDL: 14 mg/dL (ref 0.0–40.0)

## 2019-10-06 LAB — HEMOGLOBIN A1C: Hgb A1c MFr Bld: 5.7 % (ref 4.6–6.5)

## 2019-10-06 LAB — COMPREHENSIVE METABOLIC PANEL
ALT: 17 U/L (ref 0–53)
AST: 17 U/L (ref 0–37)
Albumin: 4 g/dL (ref 3.5–5.2)
Alkaline Phosphatase: 75 U/L (ref 39–117)
BUN: 17 mg/dL (ref 6–23)
CO2: 28 mEq/L (ref 19–32)
Calcium: 9 mg/dL (ref 8.4–10.5)
Chloride: 106 mEq/L (ref 96–112)
Creatinine, Ser: 0.97 mg/dL (ref 0.40–1.50)
GFR: 76.95 mL/min (ref >60.00)
Glucose, Bld: 104 mg/dL — ABNORMAL HIGH (ref 70–99)
Potassium: 4.5 mEq/L (ref 3.5–5.1)
Sodium: 141 mEq/L (ref 135–145)
Total Bilirubin: 0.5 mg/dL (ref 0.2–1.2)
Total Protein: 6.3 g/dL (ref 6.0–8.3)

## 2019-10-06 NOTE — Progress Notes (Signed)
Subjective:   Jorge Benton is a 69 y.o. male who presents for Medicare Annual/Subsequent preventive examination.  Review of Systems: N/A   This visit is being conducted through telemedicine via telephone at the nurse health advisor's home address due to the COVID-19 pandemic. This patient has given me verbal consent via doximity to conduct this visit, patient states they are participating from their home address. Patient and myself are on the telephone call. There is no referral for this visit. Some vital signs may be absent or patient reported.    Patient identification: identified by name, DOB, and current address   Cardiac Risk Factors include: advanced age (>43men, >14 women);dyslipidemia;male gender     Objective:    Vitals: There were no vitals taken for this visit.  There is no height or weight on file to calculate BMI.  Advanced Directives 10/06/2019 09/26/2018 09/23/2017  Does Patient Have a Medical Advance Directive? No No Yes  Type of Advance Directive - Public librarian;Living will  Copy of [place] in Chart? - - No - copy requested  Would patient like information on creating a medical advance directive? Yes (MAU/Ambulatory/Procedural Areas - Information given) Yes (MAU/Ambulatory/Procedural Areas - Information given) -    Tobacco Social History   Tobacco Use  Smoking Status Former Smoker  . Packs/day: 1.00  . Years: 45.00  . Pack years: 45.00  . Types: Cigarettes  Smokeless Tobacco Never Used     Counseling given: Not Answered   Clinical Intake:  Pre-visit preparation completed: Yes  Pain : No/denies pain     Nutritional Risks: None Diabetes: No  How often do you need to have someone help you when you read instructions, pamphlets, or other written materials from your doctor or pharmacy?: 1 - Never What is the last grade level you completed in school?: Bachelors degree  Interpreter Needed?: No  Information  entered by :: CJohnson, LPN  Past Medical History:  Diagnosis Date  . Environmental allergies   . Hyperlipidemia   . Melanoma ([place]) 06/2010   [facility]  . Sinusitis 1968   severe in Hospital   Past Surgical History:  Procedure Laterality Date  . APPENDECTOMY  1965  . HERNIA REPAIR  1990  . MOHS SURGERY     06/2010- melanoma L thigh   Family History  Problem Relation Age of Onset  . Uterine cancer Mother        hysterectomy 22, chemo and radiation  . Hypothyroidism Mother   . Hypertension Mother   . Heart disease Father        MI after surgery  . Miscarriages / Stillbirths Father   . Stroke Maternal Grandfather   . Colon cancer Neg Hx   . Prostate cancer Neg Hx   . Pancreatic cancer Neg Hx   . Stomach cancer Neg Hx    Social History   Socioeconomic History  . Marital status: Married    Spouse name: Not on file  . Number of children: 3  . Years of education: Not on file  . Highest education level: Not on file  Occupational History  . Occupation: Paediatric nurse  Social Needs  . Financial resource strain: Not hard at all  . Food insecurity    Worry: Never true    Inability: Never true  . Transportation needs    Medical: No    Non-medical: No  Tobacco Use  . Smoking status: Former Smoker    Packs/day:  1.00    Years: 45.00    Pack years: 45.00    Types: Cigarettes  . Smokeless tobacco: Never Used  Substance and Sexual Activity  . Alcohol use: Yes    Alcohol/week: 1.0 standard drinks    Types: 1 Standard drinks or equivalent per week    Comment: cocktail  . Drug use: No  . Sexual activity: Not on file  Lifestyle  . Physical activity    Days per week: 3 days    Minutes per session: 60 min  . Stress: Not at all  Relationships  . Social Herbalist on phone: Not on file    Gets together: Not on file    Attends religious service: Not on file    Active member of club or organization: Not on file    Attends meetings of clubs or  organizations: Not on file    Relationship status: Not on file  Other Topics Concern  . Not on file  Social History Narrative   [place]   Married 1975   3 kids    Outpatient Encounter Medications as of 10/06/2019  Medication Sig  . cetirizine (ZYRTEC) 10 MG tablet Take 10 mg by mouth daily as needed.    . Diethylpropion HCl 25 MG TABS Take 25 mg by mouth 3 (three) times daily.  . Glucosamine 500 MG CAPS Take by mouth daily.    Nyoka Cowden Tea, Camellia sinensis, (GREEN TEA EXTRACT PO) Take 2 capsules by mouth daily.  Marland Kitchen ibuprofen (ADVIL,MOTRIN) 200 MG tablet Take 200 mg by mouth as needed.  . Multiple Vitamins-Minerals (CENTRUM SILVER PO) Take by mouth daily.    . Pyridoxine HCl (B-6 PO) Take 1 capsule by mouth daily.   No facility-administered encounter medications on file as of 10/06/2019.     Activities of Daily Living In your present state of health, do you have any difficulty performing the following activities: 10/06/2019  Hearing? N  Vision? N  Difficulty concentrating or making decisions? N  Walking or climbing stairs? N  Dressing or bathing? N  Doing errands, shopping? N  Preparing Food and eating ? N  Using the Toilet? N  In the past six months, have you accidently leaked urine? N  Do you have problems with loss of bowel control? N  Managing your Medications? N  Managing your Finances? N  Housekeeping or managing your Housekeeping? N  Some recent data might be hidden    Patient Care Team: Tonia Ghent, MD as PCP - General (Family Medicine) Conception Chancy, DDS as Referring Physician (Dentistry) Eula Flax, OD as Referring Physician (Optometry)   Assessment:   This is a routine wellness examination for Jorge Benton.  Exercise Activities and Dietary recommendations Current Exercise Habits: Structured exercise class, Type of exercise: strength training/weights, Time (Minutes): 60, Frequency (Times/Week): 3, Weekly Exercise (Minutes/Week): 180, Intensity:  Moderate, Exercise limited by: None identified  Goals    . Increase physical activity     Starting 09/26/2018, I will continue to walk for 30 minutes 2-3 times per day and to ride stationary bike for 30 minutes twice weekly.     . Patient Stated     10/06/2019, I will continue to exercise and work on losing weight       Fall Risk Fall Risk  10/06/2019 09/26/2018 09/23/2017 09/22/2016  Falls in the past year? 0 0 No No  Number falls in past yr: 0 - - -  Injury with Fall? 0 - - -  Follow up Falls evaluation completed;Falls prevention discussed - - -   Is the patient's home free of loose throw rugs in walkways, pet beds, electrical cords, etc?   yes      Grab bars in the bathroom? no      Handrails on the stairs?   yes      Adequate lighting?   yes  Timed Get Up and Go Performed: N/A  Depression Screen PHQ 2/9 Scores 10/06/2019 09/26/2018 09/23/2017 09/22/2016  PHQ - 2 Score 0 0 0 0  PHQ- 9 Score 0 0 0 -    Cognitive Function MMSE - Mini Mental State Exam 10/06/2019 09/26/2018 09/23/2017  Orientation to time 5 5 5   Orientation to Place 5 5 5   Registration 3 3 3   Attention/ Calculation 5 0 0  Recall 3 3 3   Language- name 2 objects - 0 0  Language- repeat 1 1 1   Language- follow 3 step command - 3 3  Language- read & follow direction - 0 0  Write a sentence - 0 0  Copy design - 0 0  Total score - 20 20  Mini Cog  Mini-Cog screen was completed. Maximum score is 22. A value of 0 denotes this part of the MMSE was not completed or the patient failed this part of the Mini-Cog screening.       Immunization History  Administered Date(s) Administered  . Influenza,inj,Quad PF,6+ Mos 09/12/2013, 09/13/2014, 09/16/2015, 09/22/2016, 10/01/2017, 09/26/2018  . Pneumococcal Conjugate-13 09/22/2016  . Pneumococcal Polysaccharide-23 10/01/2017  . Td 07/24/2003  . Tdap 09/12/2013  . Zoster 05/02/2012    Qualifies for Shingles Vaccine? Yes  Screening Tests Health Maintenance   Topic Date Due  . COLONOSCOPY  11/27/2018  . INFLUENZA VACCINE  06/17/2019  . TETANUS/TDAP  09/13/2023  . Hepatitis C Screening  Completed  . PNA vac Low Risk Adult  Completed   Cancer Screenings: Lung: Low Dose CT Chest recommended if Age 32-80 years, 30 pack-year currently smoking OR have quit w/in 15years. Patient does not qualify. Colorectal: will complete once pandemic gets under control  Additional Screenings:  Hepatitis C Screening: 09/15/2016      Plan:   Patient will continue to exercise and lose weight  I have personally reviewed and noted the following in the patient's chart:   . Medical and social history . Use of alcohol, tobacco or illicit drugs  . Current medications and supplements . Functional ability and status . Nutritional status . Physical activity . Advanced directives . List of other physicians . Hospitalizations, surgeries, and ER visits in previous 12 months . Vitals . Screenings to include cognitive, depression, and falls . Referrals and appointments  In addition, I have reviewed and discussed with patient certain preventive protocols, quality metrics, and best practice recommendations. A written personalized care plan for preventive services as well as general preventive health recommendations were provided to patient.     Andrez Grime, LPN  [id number]

## 2019-10-06 NOTE — Patient Instructions (Signed)
[name] , Thank you for taking time to come for your Medicare Wellness Visit. I appreciate your ongoing commitment to your health goals. Please review the following plan we discussed and let me know if I can assist you in the future.   Screening recommendations/referrals: Colonoscopy: will get at later date  Recommended yearly ophthalmology/optometry visit for glaucoma screening and checkup Recommended yearly dental visit for hygiene and checkup  Vaccinations: Influenza vaccine: will get at office visit Pneumococcal vaccine: Completed series Tdap vaccine: Up to date, completed 09/12/2013 Shingles vaccine: will get if covered    Advanced directives: Advance directive discussed with you today. I have provided a copy for you to complete at home and have notarized. Once this is complete please bring a copy in to our office so we can scan it into your chart.  Conditions/risks identified: hyperlipidemia  Next appointment: 10/16/2019 @ 8:45 am   Preventive Care 65 Years and Older, Male Preventive care refers to lifestyle choices and visits with your health care provider that can promote health and wellness. What does preventive care include?  A yearly physical exam. This is also called an annual well check.  Dental exams once or twice a year.  Routine eye exams. Ask your health care provider how often you should have your eyes checked.  Personal lifestyle choices, including:  Daily care of your teeth and gums.  Regular physical activity.  Eating a healthy diet.  Avoiding tobacco and drug use.  Limiting alcohol use.  Practicing safe sex.  Taking low doses of aspirin every day.  Taking vitamin and mineral supplements as recommended by your health care provider. What happens during an annual well check? The services and screenings done by your health care provider during your annual well check will depend on your age, overall health, lifestyle risk factors, and family  history of disease. Counseling  Your health care provider may ask you questions about your:  Alcohol use.  Tobacco use.  Drug use.  Emotional well-being.  Home and relationship well-being.  Sexual activity.  Eating habits.  History of falls.  Memory and ability to understand (cognition).  Work and work Statistician. Screening  You may have the following tests or measurements:  Height, weight, and BMI.  Blood pressure.  Lipid and cholesterol levels. These may be checked every 5 years, or more frequently if you are over 68 years old.  Skin check.  Lung cancer screening. You may have this screening every year starting at age 44 if you have a 30-pack-year history of smoking and currently smoke or have quit within the past 15 years.  Fecal occult blood test (FOBT) of the stool. You may have this test every year starting at age 56.  Flexible sigmoidoscopy or colonoscopy. You may have a sigmoidoscopy every 5 years or a colonoscopy every 10 years starting at age 61.  Prostate cancer screening. Recommendations will vary depending on your family history and other risks.  Hepatitis C blood test.  Hepatitis B blood test.  Sexually transmitted disease (STD) testing.  Diabetes screening. This is done by checking your blood sugar (glucose) after you have not eaten for a while (fasting). You may have this done every 1-3 years.  Abdominal aortic aneurysm (AAA) screening. You may need this if you are a current or former smoker.  Osteoporosis. You may be screened starting at age 13 if you are at high risk. Talk with your health care provider about your test results, treatment options, and if necessary, the  need for more tests. Vaccines  Your health care provider may recommend certain vaccines, such as:  Influenza vaccine. This is recommended every year.  Tetanus, diphtheria, and acellular pertussis (Tdap, Td) vaccine. You may need a Td booster every 10 years.  Zoster vaccine.  You may need this after age 9.  Pneumococcal 13-valent conjugate (PCV13) vaccine. One dose is recommended after age 21.  Pneumococcal polysaccharide (PPSV23) vaccine. One dose is recommended after age 67. Talk to your health care provider about which screenings and vaccines you need and how often you need them. This information is not intended to replace advice given to you by your health care provider. Make sure you discuss any questions you have with your health care provider. Document Released: 11/29/2015 Document Revised: 07/22/2016 Document Reviewed: 09/03/2015 Elsevier Interactive Patient Education  2017 [place] Prevention in the Home Falls can cause injuries. They can happen to people of all ages. There are many things you can do to make your home safe and to help prevent falls. What can I do on the outside of my home?  Regularly fix the edges of walkways and driveways and fix any cracks.  Remove anything that might make you trip as you walk through a door, such as a raised step or threshold.  Trim any bushes or trees on the path to your home.  Use bright outdoor lighting.  Clear any walking paths of anything that might make someone trip, such as rocks or tools.  Regularly check to see if handrails are loose or broken. Make sure that both sides of any steps have handrails.  Any raised decks and porches should have guardrails on the edges.  Have any leaves, snow, or ice cleared regularly.  Use sand or salt on walking paths during winter.  Clean up any spills in your garage right away. This includes oil or grease spills. What can I do in the bathroom?  Use night lights.  Install grab bars by the toilet and in the tub and shower. Do not use towel bars as grab bars.  Use non-skid mats or decals in the tub or shower.  If you need to sit down in the shower, use a plastic, non-slip stool.  Keep the floor dry. Clean up any water that spills on the floor as soon  as it happens.  Remove soap buildup in the tub or shower regularly.  Attach bath mats securely with double-sided non-slip rug tape.  Do not have throw rugs and other things on the floor that can make you trip. What can I do in the bedroom?  Use night lights.  Make sure that you have a light by your bed that is easy to reach.  Do not use any sheets or blankets that are too big for your bed. They should not hang down onto the floor.  Have a firm chair that has side arms. You can use this for support while you get dressed.  Do not have throw rugs and other things on the floor that can make you trip. What can I do in the kitchen?  Clean up any spills right away.  Avoid walking on wet floors.  Keep items that you use a lot in easy-to-reach places.  If you need to reach something above you, use a strong step stool that has a grab bar.  Keep electrical cords out of the way.  Do not use floor polish or wax that makes floors slippery. If you must use wax,  use non-skid floor wax.  Do not have throw rugs and other things on the floor that can make you trip. What can I do with my stairs?  Do not leave any items on the stairs.  Make sure that there are handrails on both sides of the stairs and use them. Fix handrails that are broken or loose. Make sure that handrails are as long as the stairways.  Check any carpeting to make sure that it is firmly attached to the stairs. Fix any carpet that is loose or worn.  Avoid having throw rugs at the top or bottom of the stairs. If you do have throw rugs, attach them to the floor with carpet tape.  Make sure that you have a light switch at the top of the stairs and the bottom of the stairs. If you do not have them, ask someone to add them for you. What else can I do to help prevent falls?  Wear shoes that:  Do not have high heels.  Have rubber bottoms.  Are comfortable and fit you well.  Are closed at the toe. Do not wear sandals.  If  you use a stepladder:  Make sure that it is fully opened. Do not climb a closed stepladder.  Make sure that both sides of the stepladder are locked into place.  Ask someone to hold it for you, if possible.  Clearly mark and make sure that you can see:  Any grab bars or handrails.  First and last steps.  Where the edge of each step is.  Use tools that help you move around (mobility aids) if they are needed. These include:  Canes.  Walkers.  Scooters.  Crutches.  Turn on the lights when you go into a dark area. Replace any light bulbs as soon as they burn out.  Set up your furniture so you have a clear path. Avoid moving your furniture around.  If any of your floors are uneven, fix them.  If there are any pets around you, be aware of where they are.  Review your medicines with your doctor. Some medicines can make you feel dizzy. This can increase your chance of falling. Ask your doctor what other things that you can do to help prevent falls. This information is not intended to replace advice given to you by your health care provider. Make sure you discuss any questions you have with your health care provider. Document Released: 08/29/2009 Document Revised: 04/09/2016 Document Reviewed: 12/07/2014 Elsevier Interactive Patient Education  2017 [employer].

## 2019-10-06 NOTE — Progress Notes (Signed)
PCP notes:  Health Maintenance: Will complete colonoscopy when pandemic is controlled Need flu and shingrix vaccine   Abnormal Screenings: none   Patient concerns: none   Nurse concerns: none   Next PCP appt.: 10/16/2019 @ 8:45 am

## 2019-10-11 ENCOUNTER — Other Ambulatory Visit: Payer: Self-pay

## 2019-10-16 ENCOUNTER — Ambulatory Visit (INDEPENDENT_AMBULATORY_CARE_PROVIDER_SITE_OTHER): Payer: Medicare Other | Admitting: Family Medicine

## 2019-10-16 ENCOUNTER — Other Ambulatory Visit: Payer: Self-pay

## 2019-10-16 ENCOUNTER — Encounter: Payer: Self-pay | Admitting: Family Medicine

## 2019-10-16 VITALS — BP 136/80 | HR 97 | Temp 97.5°F | Ht 71.0 in | Wt 285.6 lb

## 2019-10-16 DIAGNOSIS — Z7189 Other specified counseling: Secondary | ICD-10-CM

## 2019-10-16 DIAGNOSIS — Z0000 Encounter for general adult medical examination without abnormal findings: Secondary | ICD-10-CM

## 2019-10-16 DIAGNOSIS — Z23 Encounter for immunization: Secondary | ICD-10-CM

## 2019-10-16 DIAGNOSIS — E785 Hyperlipidemia, unspecified: Secondary | ICD-10-CM

## 2019-10-16 DIAGNOSIS — R1013 Epigastric pain: Secondary | ICD-10-CM

## 2019-10-16 MED ORDER — PRAVASTATIN SODIUM 10 MG PO TABS: 10.0000 mg | ORAL_TABLET | Freq: Every day | ORAL | 3 refills | Status: DC

## 2019-10-16 NOTE — Patient Instructions (Addendum)
Check with your insurance to see if they will cover the shingrix shot. Call me if you need help getting set up for a colonoscopy.   Start pravastatin.  Stop if you have aches and update me.  Otherwise recheck lipids in about 2 months.  I put in the lab order.   Keep working on diet and exercise.  If you want to go for the lung cancer screening, then let me know.  Take care.  Glad to see you.  Update me as needed.  If you need TUMS frequently, then let me know.

## 2019-10-16 NOTE — Progress Notes (Signed)
This visit occurred during the SARS-CoV-2 public health emergency.  Safety protocols were in place, including screening questions prior to the visit, additional usage of staff PPE, and extensive cleaning of exam room while observing appropriate contact time as indicated for disinfecting solutions.   His sister has memory loss d/w pt.  He has no memory changes, recent testing wnl.    Covid pandemic cautions discussed with patient.  Flu vaccine -2020 PNA up to date Tdap 2014 shingrix d/w pt.   Colonoscopy 2015. D/w pt about options. He wanted to wait a few months and then readdress.   Prostate cancer screening and PSA options(with potential risks and benefits of testing vs not testing) were discussed along with recent recs/guidelines. He declined testing PSAat this point. AAA screen prev done.  Advance directive-wife designated if patient were incapacitated. Lung cancer screening program d/w pt. >30 pack year hx. Quit about ~2011. Encouraged screening, he'll consider.  Diet and exercise d/w pt. "Diet has been up and down.  Exercise got better when [facility]."    Lipids and sugar d/w pt. No meds currently.  See below.    The 10-year ASCVD risk score Mikey Bussing DC Brooke Bonito., et al., 2013) is: 18.9%   Values used to calculate the score:     Age: 68 years     Sex: Male     Is Non-Hispanic African American: No     Diabetic: No     Tobacco smoker: No     Systolic Blood Pressure: [id number] mmHg     Is BP treated: No     HDL Cholesterol: 34.5 mg/dL     Total Cholesterol: 169 mg/dL   Stomach rumbling with burping noted, but better recently.  No CP, SOB, BLE edema.  No blood in stool.  TUMS help, d/w pt that he could continue prn.   PMH and SH reviewed  ROS: Per HPI unless specifically indicated in ROS section   Meds, vitals, and allergies reviewed.   GEN: nad, alert and oriented HEENT: ncat NECK: supple w/o LA CV: rrr.  PULM: ctab, no inc wob ABD: soft, +bs EXT: no edema SKIN:  no acute rash\

## 2019-10-18 DIAGNOSIS — R1013 Epigastric pain: Secondary | ICD-10-CM | POA: Insufficient documentation

## 2019-10-18 NOTE — Assessment & Plan Note (Signed)
Flu vaccine -2020 PNA up to date Tdap 2014 shingrix d/w pt.   Colonoscopy 2015. D/w pt about options. He wanted to wait a few months and then readdress.   Prostate cancer screening and PSA options(with potential risks and benefits of testing vs not testing) were discussed along with recent recs/guidelines. He declined testing PSAat this point. AAA screen prev done.  Advance directive-wife designated if patient were incapacitated. Lung cancer screening program d/w pt. >30 pack year hx. Quit about ~2011. Encouraged screening, he'll consider.  Diet and exercise d/w pt. "Diet has been up and down.  Exercise got better when the [facility] opened back up."

## 2019-10-18 NOTE — Assessment & Plan Note (Signed)
Advance directive- wife designated if patient were incapacitated.  

## 2019-10-18 NOTE — Assessment & Plan Note (Signed)
Stomach rumbling with burping noted, but better recently.  No CP, SOB, BLE edema.  No blood in stool.  TUMS help, d/w pt that he could continue prn.  He will update me as needed.

## 2019-10-18 NOTE — Assessment & Plan Note (Signed)
Lipids and sugar d/w pt. detailed conversation.  Discussed diet and exercise.  Discussed statin start given his ASCVD score.  Routine cautions given.  Start pravastatin 10 mg a day.  Update me if not tolerated.  Recheck lipids in the future to fasting lab visit.  He agrees. >25 minutes spent in face to face time with patient, >50% spent in counselling or coordination of care.  The 10-year ASCVD risk score Mikey Bussing DC Brooke Bonito., et al., 2013) is 18.9%   Values used to calculate the score:     Age: 68 years     Sex: Male     Is Non-Hispanic African American: No     Diabetic: No     Tobacco smoker: No     Systolic Blood Pressure: [id number] mmHg     Is BP treated: No     HDL Cholesterol: 34.5 mg/dL     Total Cholesterol: 169 mg/dL

## 2019-11-01 DIAGNOSIS — C44722 Squamous cell carcinoma of skin of right lower limb, including hip: Secondary | ICD-10-CM | POA: Diagnosis not present

## 2019-12-05 ENCOUNTER — Ambulatory Visit: Payer: Medicare Other | Attending: Internal Medicine

## 2019-12-05 DIAGNOSIS — Z23 Encounter for immunization: Secondary | ICD-10-CM

## 2019-12-05 NOTE — Progress Notes (Signed)
   Covid-19 Vaccination Clinic  Name:  Jorge Benton    MRN: [id number] DOB: [birth year]  12/05/2019  Jorge Benton was observed post Covid-19 immunization for 15 minutes without incidence. He was provided with Vaccine Information Sheet and instruction to access the V-Safe system.   Jorge Benton was instructed to call 911 with any severe reactions post vaccine: Marland Kitchen Difficulty breathing  . Swelling of your face and throat  . A fast heartbeat  . A bad rash all over your body  . Dizziness and weakness    Immunizations Administered    Name Date Dose VIS Date Route   Pfizer COVID-19 Vaccine 12/05/2019  5:24 PM 0.3 mL 10/27/2019 Intramuscular   Manufacturer: [place]   Lot: [id number]   [place]: [id number]

## 2019-12-13 ENCOUNTER — Encounter: Payer: Self-pay | Admitting: Family Medicine

## 2019-12-13 ENCOUNTER — Other Ambulatory Visit: Payer: Self-pay

## 2019-12-13 ENCOUNTER — Other Ambulatory Visit (INDEPENDENT_AMBULATORY_CARE_PROVIDER_SITE_OTHER): Payer: Medicare Other

## 2019-12-13 DIAGNOSIS — E785 Hyperlipidemia, unspecified: Secondary | ICD-10-CM

## 2019-12-13 LAB — LIPID PANEL
Cholesterol: 142 mg/dL (ref 0–200)
HDL: 34.9 mg/dL — ABNORMAL LOW (ref >39.00)
LDL Cholesterol: 92 mg/dL (ref 0–99)
NonHDL: 106.96
Total CHOL/HDL Ratio: 4
Triglycerides: 75 mg/dL (ref 0.0–149.0)
VLDL: 15 mg/dL (ref 0.0–40.0)

## 2019-12-25 ENCOUNTER — Ambulatory Visit: Payer: Medicare Other | Attending: Internal Medicine

## 2019-12-25 DIAGNOSIS — Z23 Encounter for immunization: Secondary | ICD-10-CM

## 2019-12-25 NOTE — Progress Notes (Signed)
   Covid-19 Vaccination Clinic  Name:  Jorge Benton    MRN: [id number] DOB: 1951/01/28  12/25/2019  [name] was observed post Covid-19 immunization for 15 minutes without incidence. He was provided with Vaccine Information Sheet and instruction to access the V-Safe system.   [name] was instructed to call 911 with any severe reactions post vaccine: Marland Kitchen Difficulty breathing  . Swelling of your face and throat  . A fast heartbeat  . A bad rash all over your body  . Dizziness and weakness    Immunizations Administered    Name Date Dose VIS Date Route   Pfizer COVID-19 Vaccine 12/25/2019  8:56 AM 0.3 mL 10/27/2019 Intramuscular   Manufacturer: [place]   Lot: [id number]   [place]: [id number]

## 2020-03-05 DIAGNOSIS — D225 Melanocytic nevi of trunk: Secondary | ICD-10-CM | POA: Diagnosis not present

## 2020-03-05 DIAGNOSIS — L570 Actinic keratosis: Secondary | ICD-10-CM | POA: Diagnosis not present

## 2020-03-05 DIAGNOSIS — C44712 Basal cell carcinoma of skin of right lower limb, including hip: Secondary | ICD-10-CM | POA: Diagnosis not present

## 2020-03-05 DIAGNOSIS — Z85828 Personal history of other malignant neoplasm of skin: Secondary | ICD-10-CM | POA: Diagnosis not present

## 2020-03-05 DIAGNOSIS — Z85820 Personal history of malignant melanoma of skin: Secondary | ICD-10-CM | POA: Diagnosis not present

## 2020-03-05 DIAGNOSIS — D2262 Melanocytic nevi of left upper limb, including shoulder: Secondary | ICD-10-CM | POA: Diagnosis not present

## 2020-03-05 DIAGNOSIS — D485 Neoplasm of uncertain behavior of skin: Secondary | ICD-10-CM | POA: Diagnosis not present

## 2020-03-05 DIAGNOSIS — X32XXXA Exposure to sunlight, initial encounter: Secondary | ICD-10-CM | POA: Diagnosis not present

## 2020-03-05 DIAGNOSIS — L821 Other seborrheic keratosis: Secondary | ICD-10-CM | POA: Diagnosis not present

## 2020-03-05 DIAGNOSIS — D2271 Melanocytic nevi of right lower limb, including hip: Secondary | ICD-10-CM | POA: Diagnosis not present

## 2020-04-19 DIAGNOSIS — C44712 Basal cell carcinoma of skin of right lower limb, including hip: Secondary | ICD-10-CM | POA: Diagnosis not present

## 2020-10-06 ENCOUNTER — Other Ambulatory Visit: Payer: Self-pay | Admitting: Family Medicine

## 2020-10-06 DIAGNOSIS — E785 Hyperlipidemia, unspecified: Secondary | ICD-10-CM

## 2020-10-06 DIAGNOSIS — R739 Hyperglycemia, unspecified: Secondary | ICD-10-CM

## 2020-10-09 ENCOUNTER — Other Ambulatory Visit: Payer: Self-pay

## 2020-10-09 ENCOUNTER — Other Ambulatory Visit (INDEPENDENT_AMBULATORY_CARE_PROVIDER_SITE_OTHER): Payer: Medicare Other

## 2020-10-09 DIAGNOSIS — E785 Hyperlipidemia, unspecified: Secondary | ICD-10-CM | POA: Diagnosis not present

## 2020-10-09 DIAGNOSIS — R739 Hyperglycemia, unspecified: Secondary | ICD-10-CM | POA: Diagnosis not present

## 2020-10-09 LAB — COMPREHENSIVE METABOLIC PANEL
ALT: 18 U/L (ref 0–53)
AST: 19 U/L (ref 0–37)
Albumin: 4 g/dL (ref 3.5–5.2)
Alkaline Phosphatase: 60 U/L (ref 39–117)
BUN: 18 mg/dL (ref 6–23)
CO2: 29 mEq/L (ref 19–32)
Calcium: 9 mg/dL (ref 8.4–10.5)
Chloride: 104 mEq/L (ref 96–112)
Creatinine, Ser: 1.09 mg/dL (ref 0.40–1.50)
GFR: 69.46 mL/min (ref >60.00)
Glucose, Bld: 110 mg/dL — ABNORMAL HIGH (ref 70–99)
Potassium: 3.9 mEq/L (ref 3.5–5.1)
Sodium: 139 mEq/L (ref 135–145)
Total Bilirubin: 0.4 mg/dL (ref 0.2–1.2)
Total Protein: 6.8 g/dL (ref 6.0–8.3)

## 2020-10-09 LAB — LIPID PANEL
Cholesterol: 139 mg/dL (ref 0–200)
HDL: 35.6 mg/dL — ABNORMAL LOW (ref >39.00)
LDL Cholesterol: 86 mg/dL (ref 0–99)
NonHDL: 103.14
Total CHOL/HDL Ratio: 4
Triglycerides: 85 mg/dL (ref 0.0–149.0)
VLDL: 17 mg/dL (ref 0.0–40.0)

## 2020-10-09 LAB — HEMOGLOBIN A1C: Hgb A1c MFr Bld: 5.8 % (ref 4.6–6.5)

## 2020-10-17 ENCOUNTER — Encounter: Payer: Medicare Other | Admitting: Family Medicine

## 2020-10-21 ENCOUNTER — Other Ambulatory Visit: Payer: Self-pay

## 2020-10-21 ENCOUNTER — Encounter: Payer: Self-pay | Admitting: Family Medicine

## 2020-10-21 ENCOUNTER — Ambulatory Visit (INDEPENDENT_AMBULATORY_CARE_PROVIDER_SITE_OTHER): Payer: Medicare Other | Admitting: Family Medicine

## 2020-10-21 VITALS — BP 132/88 | HR 92 | Temp 96.4°F | Ht 71.0 in | Wt 294.1 lb

## 2020-10-21 DIAGNOSIS — Z0000 Encounter for general adult medical examination without abnormal findings: Secondary | ICD-10-CM

## 2020-10-21 DIAGNOSIS — Z23 Encounter for immunization: Secondary | ICD-10-CM | POA: Diagnosis not present

## 2020-10-21 DIAGNOSIS — E785 Hyperlipidemia, unspecified: Secondary | ICD-10-CM | POA: Diagnosis not present

## 2020-10-21 DIAGNOSIS — Z1211 Encounter for screening for malignant neoplasm of colon: Secondary | ICD-10-CM

## 2020-10-21 DIAGNOSIS — Z7189 Other specified counseling: Secondary | ICD-10-CM

## 2020-10-21 DIAGNOSIS — Z87891 Personal history of nicotine dependence: Secondary | ICD-10-CM

## 2020-10-21 MED ORDER — PRAVASTATIN SODIUM 10 MG PO TABS: 10.0000 mg | ORAL_TABLET | Freq: Every day | ORAL | 3 refills | Status: DC

## 2020-10-21 NOTE — Progress Notes (Signed)
This visit occurred during the SARS-CoV-2 public health emergency.  Safety protocols were in place, including screening questions prior to the visit, additional usage of staff PPE, and extensive cleaning of exam room while observing appropriate contact time as indicated for disinfecting solutions.  Elevated Cholesterol: Using medications without problems: yes Muscle aches: no Diet compliance: encouraged.   Exercise: yes  He has routine f/u with dermatology.  dw pt. I will defer.  He agrees.  Flu vaccine -2021 PNA up to date Tdap 2014 shingrix d/w pt. covid vaccine 2021.     Colonoscopy 2015. Referral placed.  Prostate cancer screening and PSA options(with potential risks and benefits of testing vs not testing) were discussed along with recent recs/guidelines. He declined testing PSAat this point. AAA screen prev done.  Advance directive-wife designated if patient were incapacitated. Lung cancer screening program d/w pt. >30 pack year hx. Quit about ~2011. ordered.    PMH and SH reviewed  Meds, vitals, and allergies reviewed.   ROS: Per HPI unless specifically indicated in ROS section   GEN: nad, alert and oriented HEENT: ncat NECK: supple w/o LA CV: rrr. PULM: ctab, no inc wob ABD: soft, +bs EXT: no edema SKIN: Well-perfused.

## 2020-10-21 NOTE — Patient Instructions (Addendum)
Ask to get on the schedule for a shingles shot series next year.  Take care.  Glad to see you. We'll call about the GI and lung screening clinics.

## 2020-10-23 NOTE — Assessment & Plan Note (Signed)
Flu vaccine -2021 PNA up to date Tdap 2014 shingrix d/w pt. covid vaccine 2021.     Colonoscopy 2015. Referral placed.  Prostate cancer screening and PSA options(with potential risks and benefits of testing vs not testing) were discussed along with recent recs/guidelines. He declined testing PSAat this point. AAA screen prev done.  Advance directive-wife designated if patient were incapacitated. Lung cancer screening program d/w pt. >30 pack year hx. Quit about ~2011. ordered.

## 2020-10-23 NOTE — Assessment & Plan Note (Signed)
Continue pravastatin.  Continue work on diet and exercise.  Labs discussed with patient.  He agrees.

## 2020-10-23 NOTE — Assessment & Plan Note (Signed)
Advance directive- wife designated if patient were incapacitated.  

## 2020-11-05 ENCOUNTER — Other Ambulatory Visit: Payer: Self-pay | Admitting: *Deleted

## 2020-11-05 DIAGNOSIS — Z87891 Personal history of nicotine dependence: Secondary | ICD-10-CM

## 2020-12-10 DIAGNOSIS — D485 Neoplasm of uncertain behavior of skin: Secondary | ICD-10-CM | POA: Diagnosis not present

## 2020-12-10 DIAGNOSIS — Z85828 Personal history of other malignant neoplasm of skin: Secondary | ICD-10-CM | POA: Diagnosis not present

## 2020-12-10 DIAGNOSIS — X32XXXA Exposure to sunlight, initial encounter: Secondary | ICD-10-CM | POA: Diagnosis not present

## 2020-12-10 DIAGNOSIS — L111 Transient acantholytic dermatosis [Grover]: Secondary | ICD-10-CM | POA: Diagnosis not present

## 2020-12-10 DIAGNOSIS — C44519 Basal cell carcinoma of skin of other part of trunk: Secondary | ICD-10-CM | POA: Diagnosis not present

## 2020-12-10 DIAGNOSIS — Z85820 Personal history of malignant melanoma of skin: Secondary | ICD-10-CM | POA: Diagnosis not present

## 2020-12-10 DIAGNOSIS — L821 Other seborrheic keratosis: Secondary | ICD-10-CM | POA: Diagnosis not present

## 2020-12-10 DIAGNOSIS — L570 Actinic keratosis: Secondary | ICD-10-CM | POA: Diagnosis not present

## 2020-12-11 ENCOUNTER — Other Ambulatory Visit: Payer: Self-pay

## 2020-12-11 ENCOUNTER — Ambulatory Visit (INDEPENDENT_AMBULATORY_CARE_PROVIDER_SITE_OTHER): Payer: Medicare Other | Admitting: Acute Care

## 2020-12-11 ENCOUNTER — Ambulatory Visit
Admission: RE | Admit: 2020-12-11 | Discharge: 2020-12-11 | Disposition: A | Discharge status: Home / Self Care | Payer: Medicare Other | Source: Ambulatory Visit | Attending: Acute Care | Admitting: Acute Care

## 2020-12-11 ENCOUNTER — Encounter: Payer: Self-pay | Admitting: Acute Care

## 2020-12-11 VITALS — BP 130/72 | HR 73 | Temp 97.5°F | Ht 71.0 in | Wt 301.0 lb

## 2020-12-11 DIAGNOSIS — I2510 Atherosclerotic heart disease of native coronary artery without angina pectoris: Secondary | ICD-10-CM | POA: Diagnosis not present

## 2020-12-11 DIAGNOSIS — K449 Diaphragmatic hernia without obstruction or gangrene: Secondary | ICD-10-CM | POA: Diagnosis not present

## 2020-12-11 DIAGNOSIS — Z87891 Personal history of nicotine dependence: Secondary | ICD-10-CM

## 2020-12-11 DIAGNOSIS — Z85820 Personal history of malignant melanoma of skin: Secondary | ICD-10-CM | POA: Diagnosis not present

## 2020-12-11 DIAGNOSIS — Z122 Encounter for screening for malignant neoplasm of respiratory organs: Secondary | ICD-10-CM

## 2020-12-11 NOTE — Progress Notes (Signed)
Shared Decision Making Visit Lung Cancer Screening Program [phone])   Eligibility:  Age 70 y.o.  Pack Years Smoking History Calculation 40 pack year smoking history (# packs/per year x # years smoked)  Recent History of coughing up blood  no  Unexplained weight loss? no ( >Than 15 pounds within the last 6 months )  Prior History Lung / other cancer no (Diagnosis within the last 5 years already requiring surveillance chest CT Scans).  Smoking Status Former Smoker  Former Smokers: Years since quit:10 years  Quit Date:   Visit Components:  Discussion included one or more decision making aids. yes  Discussion included risk/benefits of screening. yes  Discussion included potential follow up diagnostic testing for abnormal scans. yes  Discussion included meaning and risk of over diagnosis. yes  Discussion included meaning and risk of False Positives. yes  Discussion included meaning of total radiation exposure. yes  Counseling Included:  Importance of adherence to annual lung cancer LDCT screening. yes  Impact of comorbidities on ability to participate in the program. yes  Ability and willingness to under diagnostic treatment. yes  Smoking Cessation Counseling:  Current Smokers:   Discussed importance of smoking cessation. yes  Information about tobacco cessation classes and interventions provided to patient. yes  Patient provided with "ticket" for LDCT Scan. yes  Symptomatic Patient. no  Counseling  Diagnosis Code: Tobacco Use Z72.0  Asymptomatic Patient yes  Counseling (Intermediate counseling: > three minutes counseling) D6222  Former Smokers:   Discussed the importance of maintaining cigarette abstinence. yes  Diagnosis Code: Personal History of Nicotine Dependence. 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 Yes  Patient provided with "ticket" for LDCT Scan. yes  Written Order for Lung Cancer  Screening with LDCT placed in Epic. Yes (CT Chest Lung Cancer Screening Low Dose W/O CM) [id number] Z12.2-Screening of respiratory organs Z87.891-Personal history of nicotine dependence  I spent 25 minutes of face to face time with Mr. Rayman discussing the risks and benefits of lung cancer screening. We viewed a power point together that explained in detail the above noted topics. We took the time to pause the power point at intervals to allow for questions to be asked and answered to ensure understanding. We discussed that he had taken the single most powerful action possible to decrease his risk of developing lung cancer when he quit smoking. I counseled him to remain smoke free, and to contact me if he ever had the desire to smoke again so that I can provide resources and tools to help support the effort to remain smoke free. We discussed the time and location of the scan, and that either  Doroteo Glassman RN or I will call with the results within  24-48 hours of receiving them. He has my card and contact information in the event he needs to speak with me, in addition to a copy of the power point we reviewed as a resource. He verbalized understanding of all of the above and had no further questions upon leaving the office.     I explained to the patient that there has been a high incidence of coronary artery disease noted on these exams. I explained that this is a non-gated exam therefore degree or severity cannot be determined. This patient is currentlyon statin therapy. I have asked the patient to follow-up with their PCP regarding any incidental finding of coronary artery disease and management with diet or medication as they feel is clinically indicated. The  patient verbalized understanding of the above and had no further questions.     Magdalen Spatz, NP 12/11/2020

## 2020-12-11 NOTE — Patient Instructions (Signed)
Thank you for participating in the [facility] Screening Program. It was our pleasure to meet you today. We will call you with the results of your scan within the next few days. Your scan will be assigned a Lung RADS category score by the physicians reading the scans.  This Lung RADS score determines follow up scanning.  See below for description of categories, and follow up screening recommendations. We will be in touch to schedule your follow up screening annually or based on recommendations of our providers. We will fax a copy of your scan results to your Primary Care Physician, or the physician who referred you to the program, to ensure they have the results. Please call the office if you have any questions or concerns regarding your scanning experience or results.  Our office number is [phone]. Please speak with Denise Phelps, RN. She is our Lung Cancer Screening RN. If she is unavailable when you call, please have the office staff send her a message. She will return your call at her earliest convenience. Remember, if your scan is normal, we will scan you annually as long as you continue to meet the criteria for the program. (Age 55-77, Current smoker or smoker who has quit within the last 15 years). If you are a smoker, remember, quitting is the single most powerful action that you can take to decrease your risk of lung cancer and other pulmonary, breathing related problems. We know quitting is hard, and we are here to help.  Please let us know if there is anything we can do to help you meet your goal of quitting. If you are a former smoker, congratulations. We are proud of you! Remain smoke free! Remember you can refer friends or family members through the number above.  We will screen them to make sure they meet criteria for the program. Thank you for helping us take better care of you by participating in Lung Screening.  Lung RADS Categories:  Lung RADS 1: no nodules  or definitely non-concerning nodules.  Recommendation is for a repeat annual scan in 12 months.  Lung RADS 2:  nodules that are non-concerning in appearance and behavior with a very low likelihood of becoming an active cancer. Recommendation is for a repeat annual scan in 12 months.  Lung RADS 3: nodules that are probably non-concerning , includes nodules with a low likelihood of becoming an active cancer.  Recommendation is for a 6-month repeat screening scan. Often noted after an upper respiratory illness. We will be in touch to make sure you have no questions, and to schedule your 6-month scan.  Lung RADS 4 A: nodules with concerning findings, recommendation is most often for a follow up scan in 3 months or additional testing based on our provider's assessment of the scan. We will be in touch to make sure you have no questions and to schedule the recommended 3 month follow up scan.  Lung RADS 4 B:  indicates findings that are concerning. We will be in touch with you to schedule additional diagnostic testing based on our provider's  assessment of the scan.   

## 2020-12-19 NOTE — Progress Notes (Signed)
Please call patient and let them  know their  low dose Ct was read as a Lung RADS 2: nodules that are benign in appearance and behavior with a very low likelihood of becoming a clinically active cancer due to size or lack of growth. Recommendation per radiology is for a repeat LDCT in 12 months. .Please let them  know we will order and schedule their  annual screening scan for 11/2021. Please let them  know there was notation of CAD on their  scan.  Please remind the patient  that this is a non-gated exam therefore degree or severity of disease  cannot be determined. Please have them  follow up with their PCP regarding potential risk factor modification, dietary therapy or pharmacologic therapy if clinically indicated. Pt.  is  currently on statin therapy. Please place order for annual  screening scan for  11/2021 and fax results to PCP. Thanks so much.  Langley Gauss, there was notation of 3 vessel CAD on the scan. They are on statin therapy. Last seen by cards 2017. Please have patient follow up with PCP about follow up with cards if PCP feels this is appropriate.  Thanks so much

## 2020-12-23 ENCOUNTER — Other Ambulatory Visit: Payer: Self-pay | Admitting: *Deleted

## 2020-12-23 DIAGNOSIS — Z87891 Personal history of nicotine dependence: Secondary | ICD-10-CM

## 2020-12-25 ENCOUNTER — Encounter: Payer: Self-pay | Admitting: Family Medicine

## 2020-12-25 ENCOUNTER — Telehealth: Payer: Self-pay | Admitting: Family Medicine

## 2020-12-25 DIAGNOSIS — I2510 Atherosclerotic heart disease of native coronary artery without angina pectoris: Secondary | ICD-10-CM | POA: Insufficient documentation

## 2020-12-25 DIAGNOSIS — E785 Hyperlipidemia, unspecified: Secondary | ICD-10-CM

## 2020-12-25 MED ORDER — PRAVASTATIN SODIUM 10 MG PO TABS: 20.0000 mg | ORAL_TABLET | Freq: Every day | ORAL | Status: DC

## 2020-12-25 NOTE — Telephone Encounter (Signed)
Notify patient.  He does not have any alarming findings in his lungs.  He has a small left-sided thyroid nodule that is too small to need follow-up.  This is clinically insignificant.    He does have calcium deposits in all 3 of his major heart vessels, meaning he has coronary artery disease.  Goal LDL is less than 70.  Would increase pravastatin to 20 mg a day and recheck lipids in 2 months.  Would need a follow-up lab visit.  If he needs a new prescription then let me know.  If he is having any chest pain or shortness of breath then we definitely need to get him set up with cardiology.  If he is feeling well with good exercise tolerance then cardiology referral is encouraged but less urgent.  Let me know if I need to put in a referral.  Thanks.

## 2020-12-25 NOTE — Telephone Encounter (Signed)
Spoke with patient about CT results; patient verbalized understanding. Discussed increasing pravastatin and patient agrees to do so. Lab appt set for 02/20/21 at 7:45 am. Patient has not had any chest pain or SOB and does not want cardio referral at this time but will call back if he decides later that he needs it.

## 2021-01-06 ENCOUNTER — Other Ambulatory Visit: Payer: Self-pay

## 2021-01-06 ENCOUNTER — Ambulatory Visit (AMBULATORY_SURGERY_CENTER): Payer: Self-pay | Admitting: *Deleted

## 2021-01-06 VITALS — Ht 71.0 in | Wt 301.0 lb

## 2021-01-06 DIAGNOSIS — Z86010 Personal history of colonic polyps: Secondary | ICD-10-CM

## 2021-01-06 MED ORDER — PLENVU 140 G PO SOLR: 1.0000 | ORAL | 0 refills | Status: DC

## 2021-01-06 NOTE — Progress Notes (Signed)
No egg or soy allergy known to patient  No issues with past sedation with any surgeries or procedures No intubation problems in the past  No FH of Malignant Hyperthermia No diet pills per patient No home 02 use per patient  No blood thinners per patient  Pt denies issues with constipation  No A fib or A flutter  EMMI video to pt or via [place] 19 guidelines implemented in PV today with Pt and RN  Pt is fully vaccinated  for Covid   Plenvu medicare coupon  Coupon given to pt in PV today , Code to Pharmacy and  NO PA's for preps discussed with pt In PV today  Discussed with pt there will be an out-of-pocket cost for prep and that varies from $0 to 70 dollars  Pt given the option in PV today for Golytely prep verses  alternative prep  ( Suprep/Plenvu)-  Pt is aware the Golytely has more volume but is more cost effective and the Suprep/Plenvu is less volume but may cost $60-150.  Pt voiced understanding of this and choose Plenvu  Prep.   Due to the COVID-19 pandemic we are asking patients to follow certain guidelines.  Pt aware of COVID protocols and [facility] guidelines

## 2021-01-14 DIAGNOSIS — C44519 Basal cell carcinoma of skin of other part of trunk: Secondary | ICD-10-CM | POA: Diagnosis not present

## 2021-01-14 HISTORY — PX: SKIN SURGERY: SHX2413

## 2021-01-16 ENCOUNTER — Other Ambulatory Visit: Payer: Self-pay

## 2021-01-16 ENCOUNTER — Telehealth: Payer: Self-pay | Admitting: Family Medicine

## 2021-01-16 DIAGNOSIS — E785 Hyperlipidemia, unspecified: Secondary | ICD-10-CM

## 2021-01-16 MED ORDER — PRAVASTATIN SODIUM 10 MG PO TABS: 20.0000 mg | ORAL_TABLET | Freq: Every day | ORAL | 2 refills | Status: DC

## 2021-01-16 NOTE — Telephone Encounter (Signed)
LMTCB to see which medication he is asking about

## 2021-01-16 NOTE — Telephone Encounter (Signed)
Pt called in wanted to know about why he didn't get the change of his medication Dr. Damita Dunnings had upped the quanity from 1 pill to 2 pills and when he went to pick up his medication nothing changed.

## 2021-01-16 NOTE — Progress Notes (Signed)
See telephone note.

## 2021-01-16 NOTE — Telephone Encounter (Signed)
The medication is Pravastatin

## 2021-01-16 NOTE — Telephone Encounter (Signed)
Spoke with patient and he needs new rx sent in [facility] since dose had been increased. New rx sent and patient is aware.

## 2021-01-20 ENCOUNTER — Other Ambulatory Visit: Payer: Self-pay

## 2021-01-20 ENCOUNTER — Encounter: Payer: Self-pay | Admitting: Gastroenterology

## 2021-01-20 ENCOUNTER — Ambulatory Visit (AMBULATORY_SURGERY_CENTER): Payer: Medicare Other | Admitting: Gastroenterology

## 2021-01-20 VITALS — BP 137/67 | HR 68 | Temp 97.3°F | Resp 15 | Ht 71.0 in | Wt 301.0 lb

## 2021-01-20 DIAGNOSIS — Z86010 Personal history of colonic polyps: Secondary | ICD-10-CM | POA: Diagnosis not present

## 2021-01-20 DIAGNOSIS — E785 Hyperlipidemia, unspecified: Secondary | ICD-10-CM | POA: Diagnosis not present

## 2021-01-20 DIAGNOSIS — D123 Benign neoplasm of transverse colon: Secondary | ICD-10-CM

## 2021-01-20 DIAGNOSIS — I2510 Atherosclerotic heart disease of native coronary artery without angina pectoris: Secondary | ICD-10-CM | POA: Diagnosis not present

## 2021-01-20 DIAGNOSIS — K635 Polyp of colon: Secondary | ICD-10-CM

## 2021-01-20 DIAGNOSIS — D122 Benign neoplasm of ascending colon: Secondary | ICD-10-CM

## 2021-01-20 MED ORDER — SODIUM CHLORIDE 0.9 % IV SOLN: 500.0000 mL | INTRAVENOUS | Status: DC

## 2021-01-20 NOTE — Progress Notes (Signed)
Pt's states no medical or surgical changes since previsit or office visit. 

## 2021-01-20 NOTE — Progress Notes (Signed)
Called to room to assist during endoscopic procedure.  Patient ID and intended procedure confirmed with present staff. Received instructions for my participation in the procedure from the performing physician.  

## 2021-01-20 NOTE — Op Note (Signed)
[facility] Patient Name: Jorge Benton Procedure Date: 01/20/2021 10:25 AM MRN: [id number] Endoscopist: Mallie Mussel L. 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 The patient's tolerance of                            previous anesthesia was also reviewed. The risks                            and benefits of the procedure and the sedation                            options and risks were discussed with the patient.                            All questions were answered, and informed consent                            was obtained. Prior Anticoagulants: The patient has                            taken no previous anticoagulant or antiplatelet                            agents. ASA Grade Assessment: III - A patient with                            severe systemic disease. After reviewing the risks                            and benefits, the patient was deemed in  satisfactory condition to undergo the procedure.                           After obtaining informed consent, the colonoscope                            was passed under direct vision. Throughout the                            procedure, the patient's blood pressure, pulse, and                            oxygen saturations were monitored continuously. The                             Olympus [id number]) Colonoscope was                            introduced through the anus and advanced to the the                            cecum, identified by appendiceal orifice and                            ileocecal valve. The colonoscopy was performed                            without difficulty. The patient tolerated the                            procedure well. The quality of the bowel                            preparation was excellent. The ileocecal valve,                            appendiceal orifice, and rectum were photographed.                            The bowel preparation used was Plenvu. Scope In: 10:38:27 AM Scope Out: 11:09:45 AM Scope Withdrawal Time: 0 hours 27 minutes 19 seconds  Total Procedure Duration: 0 hours 31 minutes 18 seconds  Findings:                 The perianal and digital rectal examinations were                            normal.                           A 25 mm polyp was found in the proximal ascending                            colon. The polyp was semi-sessile. The polyp was  removed with a piecemeal technique using a cold                            snare. Resection and retrieval were complete. To                            prevent bleeding post-intervention, one hemostatic                            clip was successfully placed (MR conditional) - see                            photo.                           A 6 mm polyp was found in the proximal ascending                            colon. The polyp was semi-sessile. The polyp was                            removed with a cold snare. Resection and retrieval                            were complete. The polyp was removed with a cold                            snare. Resection and retrieval were complete.                           A 12 mm polyp was found in the distal ascending                            colon. The polyp was semi-sessile. The polyp was                             removed with a piecemeal technique using a cold                            snare. Resection and retrieval were complete.                           A 10 mm polyp was found in the distal transverse                            colon. The polyp was semi-sessile. The polyp was                            removed with a cold snare. Resection and retrieval                            were complete.  A few small-mouthed diverticula were found in the                            sigmoid colon.                           The exam was otherwise without abnormality on                            direct and retroflexion views. Complications:            No immediate complications. Estimated Blood Loss:     Estimated blood loss was minimal. Impression:               - One 25 mm polyp in the proximal ascending colon,                            removed piecemeal using a cold snare. Resected and                            retrieved. Clip (MR conditional) was placed.                           - One 6 mm polyp in the proximal ascending colon,                            removed with a cold snare. Resected and retrieved.                           - One 12 mm polyp in the distal ascending colon,                            removed piecemeal using a cold snare. Resected and                            retrieved.                           - One 10 mm polyp in the distal transverse colon,                            removed with a cold snare. Resected and retrieved.                           - Diverticulosis in the sigmoid colon.                           - The examination was otherwise normal on direct                            and retroflexion views. Recommendation:           - Patient has a contact number available for  emergencies. The signs and symptoms of potential                            delayed complications were discussed with the                             patient. Return to normal activities tomorrow.                            Written discharge instructions were provided to the                            patient.                           - Resume previous diet.                           - Continue present medications.                           - Await pathology results.                           - Repeat colonoscopy is recommended for                            surveillance. The colonoscopy date will be                            determined after pathology results from today's                            exam become available for review.                           If largest polyp is serrated as expected (or                            adenomatous), repeat exam will be in 6 months due                            to size and piecemeal removal. Mcguire Gasparyan L. Loletha Carrow, MD 01/20/2021 11:20:17 AM This report has been signed electronically.

## 2021-01-20 NOTE — Progress Notes (Signed)
Report given to PACU, vss 

## 2021-01-20 NOTE — Patient Instructions (Signed)
Handout given:  Polyps Resume previous diet Continue current medications Await pathology results  YOU HAD AN ENDOSCOPIC PROCEDURE TODAY AT THE [facility]:   Refer to the procedure report that was given to you for any specific questions about what was found during the examination.  If the procedure report does not answer your questions, please call your gastroenterologist to clarify.  If you requested that your care partner not be given the details of your procedure findings, then the procedure report has been included in a sealed envelope for you to review at your convenience later.  YOU SHOULD EXPECT: Some feelings of bloating in the abdomen. Passage of more gas than usual.  Walking can help get rid of the air that was put into your GI tract during the procedure and reduce the bloating. If you had a lower endoscopy (such as a colonoscopy or flexible sigmoidoscopy) you may notice spotting of blood in your stool or on the toilet paper. If you underwent a bowel prep for your procedure, you may not have a normal bowel movement for a few days.  Please Note:  You might notice some irritation and congestion in your nose or some drainage.  This is from the oxygen used during your procedure.  There is no need for concern and it should clear up in a day or so.  SYMPTOMS TO REPORT IMMEDIATELY:   Following lower endoscopy (colonoscopy or flexible sigmoidoscopy):  Excessive amounts of blood in the stool  Significant tenderness or worsening of abdominal pains  Swelling of the abdomen that is new, acute  Fever of 100F or higher  For urgent or emergent issues, a gastroenterologist can be reached at any hour by calling [phone]. Do not use MyChart messaging for urgent concerns.    DIET:  We do recommend a small meal at first, but then you may proceed to your regular diet.  Drink plenty of fluids but you should avoid alcoholic beverages for 24 hours.  ACTIVITY:  You should plan to take  it easy for the rest of today and you should NOT DRIVE or use heavy machinery until tomorrow (because of the sedation medicines used during the test).    FOLLOW UP: Our staff will call the number listed on your records 48-72 hours following your procedure to check on you and address any questions or concerns that you may have regarding the information given to you following your procedure. If we do not reach you, we will leave a message.  We will attempt to reach you two times.  During this call, we will ask if you have developed any symptoms of COVID 19. If you develop any symptoms (ie: fever, flu-like symptoms, shortness of breath, cough etc.) before then, please call [phone].  If you test positive for Covid 19 in the 2 weeks post procedure, please call and report this information to us.    If any biopsies were taken you will be contacted by phone or by letter within the next 1-3 weeks.  Please call us at [phone] if you have not heard about the biopsies in 3 weeks.   SIGNATURES/CONFIDENTIALITY: You and/or your care partner have signed paperwork which will be entered into your electronic medical record.  These signatures attest to the fact that that the information above on your After Visit Summary has been reviewed and is understood.  Full responsibility of the confidentiality of this discharge information lies with you and/or your care-partner. 

## 2021-01-22 ENCOUNTER — Telehealth: Payer: Self-pay

## 2021-01-22 NOTE — Telephone Encounter (Signed)
LVM

## 2021-01-27 ENCOUNTER — Encounter: Payer: Self-pay | Admitting: Gastroenterology

## 2021-02-20 ENCOUNTER — Other Ambulatory Visit: Payer: Self-pay

## 2021-02-20 ENCOUNTER — Other Ambulatory Visit (INDEPENDENT_AMBULATORY_CARE_PROVIDER_SITE_OTHER): Payer: Medicare Other

## 2021-02-20 DIAGNOSIS — I2510 Atherosclerotic heart disease of native coronary artery without angina pectoris: Secondary | ICD-10-CM | POA: Diagnosis not present

## 2021-02-20 DIAGNOSIS — E785 Hyperlipidemia, unspecified: Secondary | ICD-10-CM | POA: Diagnosis not present

## 2021-02-20 LAB — LIPID PANEL
Cholesterol: 122 mg/dL (ref 0–200)
HDL: 31.7 mg/dL — ABNORMAL LOW (ref >39.00)
LDL Cholesterol: 71 mg/dL (ref 0–99)
NonHDL: 90.55
Total CHOL/HDL Ratio: 4
Triglycerides: 97 mg/dL (ref 0.0–149.0)
VLDL: 19.4 mg/dL (ref 0.0–40.0)

## 2021-04-30 ENCOUNTER — Other Ambulatory Visit: Payer: Self-pay | Admitting: Family Medicine

## 2021-04-30 DIAGNOSIS — E785 Hyperlipidemia, unspecified: Secondary | ICD-10-CM

## 2021-05-15 DIAGNOSIS — H0011 Chalazion right upper eyelid: Secondary | ICD-10-CM | POA: Diagnosis not present

## 2021-05-15 DIAGNOSIS — H2513 Age-related nuclear cataract, bilateral: Secondary | ICD-10-CM | POA: Diagnosis not present

## 2021-06-19 ENCOUNTER — Encounter: Payer: Self-pay | Admitting: Family Medicine

## 2021-06-26 ENCOUNTER — Other Ambulatory Visit: Payer: Self-pay

## 2021-06-26 ENCOUNTER — Encounter: Payer: Self-pay | Admitting: Family Medicine

## 2021-06-26 ENCOUNTER — Ambulatory Visit (INDEPENDENT_AMBULATORY_CARE_PROVIDER_SITE_OTHER): Payer: Medicare Other | Admitting: Family Medicine

## 2021-06-26 VITALS — BP 122/80 | HR 73 | Temp 97.6°F | Ht 71.0 in | Wt 287.0 lb

## 2021-06-26 DIAGNOSIS — I2510 Atherosclerotic heart disease of native coronary artery without angina pectoris: Secondary | ICD-10-CM

## 2021-06-26 DIAGNOSIS — E785 Hyperlipidemia, unspecified: Secondary | ICD-10-CM | POA: Diagnosis not present

## 2021-06-26 DIAGNOSIS — E669 Obesity, unspecified: Secondary | ICD-10-CM

## 2021-06-26 LAB — CBC WITH DIFFERENTIAL/PLATELET
Basophils Absolute: 0 10*3/uL (ref 0.0–0.1)
Basophils Relative: 0.4 % (ref 0.0–3.0)
Eosinophils Absolute: 0.1 10*3/uL (ref 0.0–0.7)
Eosinophils Relative: 1 % (ref 0.0–5.0)
HCT: 41.3 % (ref 39.0–52.0)
Hemoglobin: 13.5 g/dL (ref 13.0–17.0)
Lymphocytes Relative: 20.3 % (ref 12.0–46.0)
Lymphs Abs: 1.5 10*3/uL (ref 0.7–4.0)
MCHC: 32.6 g/dL (ref 30.0–36.0)
MCV: 87.1 fl (ref 78.0–100.0)
Monocytes Absolute: 0.7 10*3/uL (ref 0.1–1.0)
Monocytes Relative: 9.6 % (ref 3.0–12.0)
Neutro Abs: 5 10*3/uL (ref 1.4–7.7)
Neutrophils Relative %: 68.7 % (ref 43.0–77.0)
Platelets: 194 10*3/uL (ref 150.0–400.0)
RBC: 4.74 Mil/uL (ref 4.22–5.81)
RDW: 15.4 % (ref 11.5–15.5)
WBC: 7.3 10*3/uL (ref 4.0–10.5)

## 2021-06-26 LAB — COMPREHENSIVE METABOLIC PANEL
ALT: 18 U/L (ref 0–53)
AST: 17 U/L (ref 0–37)
Albumin: 4.1 g/dL (ref 3.5–5.2)
Alkaline Phosphatase: 76 U/L (ref 39–117)
BUN: 17 mg/dL (ref 6–23)
CO2: 28 mEq/L (ref 19–32)
Calcium: 9.1 mg/dL (ref 8.4–10.5)
Chloride: 104 mEq/L (ref 96–112)
Creatinine, Ser: 1.09 mg/dL (ref 0.40–1.50)
GFR: 69.11 mL/min (ref >60.00)
Glucose, Bld: 95 mg/dL (ref 70–99)
Potassium: 4.3 mEq/L (ref 3.5–5.1)
Sodium: 139 mEq/L (ref 135–145)
Total Bilirubin: 0.6 mg/dL (ref 0.2–1.2)
Total Protein: 6.5 g/dL (ref 6.0–8.3)

## 2021-06-26 LAB — LIPASE: Lipase: 26 U/L (ref 11.0–59.0)

## 2021-06-26 LAB — TSH: TSH: 3.5 u[IU]/mL (ref 0.35–5.50)

## 2021-06-26 NOTE — Progress Notes (Signed)
This visit occurred during the SARS-CoV-2 public health emergency.  Safety protocols were in place, including screening questions prior to the visit, additional usage of staff PPE, and extensive cleaning of exam room while observing appropriate contact time as indicated for disinfecting solutions.  Wife is taking victoza. D/w pt.  He is down about 15 lbs from the spring, levelled off recently.  D/w pt about diet and exercise.  D/w pt about med options.  He is working on diet and going to [employer].  No CP with exercise.    Hyperlipidemia.  Some occ aches on '20mg'$  pravastatin, in the thighs.  Episodic.  See avs.    Meds, vitals, and allergies reviewed.   ROS: Per HPI unless specifically indicated in ROS section   GEN: nad, alert and oriented HEENT: ncat NECK: supple w/o LA CV: rrr.  PULM: ctab, no inc wob ABD: soft, +bs EXT: no edema SKIN: no acute rash  30 minutes were devoted to patient care in this encounter (this includes time spent reviewing the patient's file/history, interviewing and examining the patient, counseling/reviewing plan with patient).

## 2021-06-26 NOTE — Patient Instructions (Addendum)
You could try cutting back pravastatin and see if the aches get better.  Try alternating '10mg'$  vs '20mg'$ .  If needed, cut back to '10mg'$ .  Update me as needed.   Take care.  Glad to see you. Go to the lab on the way out.   If you have mychart we'll likely use that to update you.     Reasonable to consider victoza but I want to see your labs first.

## 2021-06-29 ENCOUNTER — Other Ambulatory Visit: Payer: Self-pay | Admitting: Family Medicine

## 2021-06-29 DIAGNOSIS — E669 Obesity, unspecified: Secondary | ICD-10-CM | POA: Insufficient documentation

## 2021-06-29 MED ORDER — PEN NEEDLES 30G X 5 MM MISC: 1 refills | Status: DC

## 2021-06-29 MED ORDER — VICTOZA 18 MG/3ML ~~LOC~~ SOPN: PEN_INJECTOR | SUBCUTANEOUS | 0 refills | Status: DC

## 2021-06-29 NOTE — Assessment & Plan Note (Signed)
BMI currently 40.  Discussed with patient about options.  Reasonable to check labs today.  Discussed routine cautions with Victoza.  It may be useful for him for weight loss.  We talked about routine use, injection, risk of pancreatitis, etc.  No history of pancreatitis.  No history of thyroid disease that would prevent use.  See notes on labs.

## 2021-06-29 NOTE — Assessment & Plan Note (Signed)
He could try cutting back pravastatin and see if the aches get better.  Try alternating '10mg'$  vs '20mg'$ .  If needed, cut back to '10mg'$ .  I asked him to update me as needed.

## 2021-06-30 ENCOUNTER — Encounter: Payer: Self-pay | Admitting: Family Medicine

## 2021-07-01 MED ORDER — VICTOZA 18 MG/3ML ~~LOC~~ SOPN: PEN_INJECTOR | SUBCUTANEOUS | 2 refills | Status: DC

## 2021-07-01 NOTE — Telephone Encounter (Signed)
Pt called following up on request to have [facility]

## 2021-07-31 DIAGNOSIS — C44519 Basal cell carcinoma of skin of other part of trunk: Secondary | ICD-10-CM | POA: Diagnosis not present

## 2021-07-31 DIAGNOSIS — D2262 Melanocytic nevi of left upper limb, including shoulder: Secondary | ICD-10-CM | POA: Diagnosis not present

## 2021-07-31 DIAGNOSIS — X32XXXA Exposure to sunlight, initial encounter: Secondary | ICD-10-CM | POA: Diagnosis not present

## 2021-07-31 DIAGNOSIS — D225 Melanocytic nevi of trunk: Secondary | ICD-10-CM | POA: Diagnosis not present

## 2021-07-31 DIAGNOSIS — D485 Neoplasm of uncertain behavior of skin: Secondary | ICD-10-CM | POA: Diagnosis not present

## 2021-07-31 DIAGNOSIS — Z85820 Personal history of malignant melanoma of skin: Secondary | ICD-10-CM | POA: Diagnosis not present

## 2021-07-31 DIAGNOSIS — Z85828 Personal history of other malignant neoplasm of skin: Secondary | ICD-10-CM | POA: Diagnosis not present

## 2021-07-31 DIAGNOSIS — L570 Actinic keratosis: Secondary | ICD-10-CM | POA: Diagnosis not present

## 2021-08-08 ENCOUNTER — Telehealth: Payer: Self-pay | Admitting: Family Medicine

## 2021-08-08 MED ORDER — PEN NEEDLES 30G X 5 MM MISC: 1 refills | Status: DC

## 2021-08-08 NOTE — Telephone Encounter (Signed)
  Encourage patient to contact the pharmacy for refills or they can request refills through [place]:  Please schedule appointment if longer than 1 year  NEXT APPOINTMENT DATE:  MEDICATION:Insulin Pen Needle (PEN NEEDLES) 30G X 5 MM MISC  Is the patient out of medication? Yes  PHARMACY:[facility] [address]  Let patient know to contact pharmacy at the end of the day to make sure medication is ready.  Please notify patient to allow 48-72 hours to process  CLINICAL FILLS OUT ALL BELOW:   LAST REFILL:  QTY:  REFILL DATE:    OTHER COMMENTS:    Okay for refill?  Please advise

## 2021-08-08 NOTE — Telephone Encounter (Signed)
Rx sent 

## 2021-08-26 ENCOUNTER — Encounter: Payer: Self-pay | Admitting: Gastroenterology

## 2021-09-04 DIAGNOSIS — L905 Scar conditions and fibrosis of skin: Secondary | ICD-10-CM | POA: Diagnosis not present

## 2021-09-04 DIAGNOSIS — C44519 Basal cell carcinoma of skin of other part of trunk: Secondary | ICD-10-CM | POA: Diagnosis not present

## 2021-09-18 ENCOUNTER — Other Ambulatory Visit: Payer: Self-pay | Admitting: Family Medicine

## 2021-09-18 DIAGNOSIS — E785 Hyperlipidemia, unspecified: Secondary | ICD-10-CM

## 2021-10-01 ENCOUNTER — Other Ambulatory Visit: Payer: Self-pay | Admitting: Family Medicine

## 2021-10-19 ENCOUNTER — Other Ambulatory Visit: Payer: Self-pay | Admitting: Family Medicine

## 2021-10-19 DIAGNOSIS — R739 Hyperglycemia, unspecified: Secondary | ICD-10-CM

## 2021-10-19 DIAGNOSIS — E785 Hyperlipidemia, unspecified: Secondary | ICD-10-CM

## 2021-10-21 NOTE — Progress Notes (Signed)
Subjective:   Jorge Benton is a 70 y.o. male who presents for Medicare Annual/Subsequent preventive examination.  I connected with Jorge Benton today by telephone and verified that I am speaking with the correct person using two identifiers. Location patient: home Location provider: work Persons participating in the virtual visit: patient, Marine scientist.    I discussed the limitations, risks, security and privacy concerns of performing an evaluation and management service by telephone and the availability of in person appointments. I also discussed with the patient that there may be a patient responsible charge related to this service. The patient expressed understanding and verbally consented to this telephonic visit.    Interactive audio and video telecommunications were attempted between this provider and patient, however failed, due to patient having technical difficulties OR patient did not have access to video capability.  We continued and completed visit with audio only.  Some vital signs may be absent or patient reported.   Time Spent with patient on telephone encounter: 20 minutes  Review of Systems     Cardiac Risk Factors include: advanced age (>20men, >3 women);dyslipidemia     Objective:    Today's Vitals   10/24/21 0815  Weight: 277 lb (125.6 kg)  Height: 5\' 11"  (1.803 m)   Body mass index is 38.63 kg/m.  Advanced Directives 10/24/2021 10/06/2019 09/26/2018 09/23/2017  Does Patient Have a Medical Advance Directive? Yes No No Yes  Type of Paramedic of [place];Living will - - [place];Living will  Does patient want to make changes to medical advance directive? Yes (MAU/Ambulatory/Procedural Areas - Information given) - - -  Copy of [place] in Chart? - - - No - copy requested  Would patient like information on creating a medical advance directive? - Yes (MAU/Ambulatory/Procedural Areas - Information  given) Yes (MAU/Ambulatory/Procedural Areas - Information given) -    Current Medications (verified) Outpatient Encounter Medications as of 10/24/2021  Medication Sig   cetirizine (ZYRTEC) 10 MG tablet Take 10 mg by mouth daily as needed.   cholecalciferol (VITAMIN D3) 25 MCG (1000 UNIT) tablet Take 1,000 Units by mouth daily.   Cyanocobalamin (VITAMIN B 12 PO) Take by mouth.   Glucosamine 500 MG CAPS Take by mouth daily.   Green Tea, Camellia sinensis, (GREEN TEA EXTRACT PO) Take 2 capsules by mouth daily.   ibuprofen (ADVIL,MOTRIN) 200 MG tablet Take 200 mg by mouth as needed.   Insulin Pen Needle (PEN NEEDLES) 30G X 5 MM MISC Use with victoza   liraglutide (VICTOZA) 18 MG/3ML SOPN START 0.6MG  UNDER THE SKIN ONCE A DAY FOR 7 DAYS, THEN INCREASE TO 1.2MG  ONCE A DAY   Multiple Vitamins-Minerals (CENTRUM SILVER PO) Take by mouth daily.   pravastatin (PRAVACHOL) 10 MG tablet TAKE 2 TABLETS BY MOUTH DAILY (Patient taking differently: Take 10 mg by mouth daily.)   Pyridoxine HCl (B-6 PO) Take 1 capsule by mouth daily.   Diethylpropion HCl CR 75 MG TB24 Take 1 tablet by mouth daily. (Patient not taking: Reported on 10/24/2021)   No facility-administered encounter medications on file as of 10/24/2021.    Allergies (verified) Tramadol   History: Past Medical History:  Diagnosis Date   Allergy    Blood transfusion without reported diagnosis 1967   with appendectomy    CAD (coronary artery disease)    Environmental allergies    HLD (hyperlipidemia)    Hyperlipidemia    Melanoma ([place]) 06/2010   [facility]  Sinusitis 1968   severe in Hospital   Past Surgical History:  Procedure Laterality Date   [place] SURGERY     06/2010- melanoma L thigh   POLYPECTOMY     SKIN SURGERY Left 01/2021   upper left chest   Family History  Problem Relation Age of Onset   Uterine cancer Mother        hysterectomy 71, chemo and  radiation   Hypothyroidism Mother    Hypertension Mother    Heart disease Father        MI after surgery   Stroke Maternal Grandfather    Colon cancer Neg Hx    Prostate cancer Neg Hx    Pancreatic cancer Neg Hx    Stomach cancer Neg Hx    Colon polyps Neg Hx    Esophageal cancer Neg Hx    Rectal cancer Neg Hx    Social History   Socioeconomic History   Marital status: Married    Spouse name: Not on file   Number of children: 3   Years of education: Not on file   Highest education level: Not on file  Occupational History   Occupation: Paediatric nurse  Tobacco Use   Smoking status: Former    Packs/day: 1.00    Years: 45.00    Pack years: 45.00    Types: Cigarettes    Quit date: 2012    Years since quitting: 10.9   Smokeless tobacco: Never  Vaping Use   Vaping Use: Never used  Substance and Sexual Activity   Alcohol use: Yes    Alcohol/week: 1.0 standard drink    Types: 1 Standard drinks or equivalent per week    Comment: cocktail   Drug use: No   Sexual activity: Not on file  Other Topics Concern   Not on file  Social History Narrative   Technical brewer   Married 1975   3 kids   Social Determinants of Health   Financial Resource Strain: Low Risk    Difficulty of Paying Living Expenses: Not hard at all  Food Insecurity: No Food Insecurity   Worried About Charity fundraiser in the Last Year: Never true   Arboriculturist in the Last Year: Never true  Transportation Needs: No Transportation Needs   Lack of Transportation (Medical): No   Lack of Transportation (Non-Medical): No  Physical Activity: Insufficiently Active   Days of Exercise per Week: 2 days   Minutes of Exercise per Session: 60 min  Stress: No Stress Concern Present   Feeling of Stress : Not at all  Social Connections: Socially Integrated   Frequency of Communication with Friends and Family: More than three times a week   Frequency of Social Gatherings with Friends and Family: More than three  times a week   Attends Religious Services: More than 4 times per year   Active Member of [employer] or Organizations: Yes   Attends Music therapist: More than 4 times per year   Marital Status: Married    Tobacco Counseling Counseling given: Not Answered   Clinical Intake:  Pre-visit preparation completed: Yes  Pain : No/denies pain     BMI - recorded: 38.63 Nutritional Risks: None Diabetes: No  How often do you need to have someone help you when you read instructions, pamphlets, or other written materials from your doctor or pharmacy?: 1 - Never  Diabetic? No  Interpreter Needed?: No  Information entered by :: Orrin Brigham LPN   Activities of Daily Living In your present state of health, do you have any difficulty performing the following activities: 10/24/2021 06/26/2021  Hearing? N N  Vision? N N  Difficulty concentrating or making decisions? N N  Walking or climbing stairs? N N  Dressing or bathing? N N  Doing errands, shopping? N N  Preparing Food and eating ? N -  Using the Toilet? N -  In the past six months, have you accidently leaked urine? N -  Do you have problems with loss of bowel control? N -  Managing your Medications? N -  Managing your Finances? N -  Housekeeping or managing your Housekeeping? N -  Some recent data might be hidden    Patient Care Team: Tonia Ghent, MD as PCP - General (Family Medicine) Conception Chancy, DDS as Referring Physician (Dentistry) Eula Flax, OD as Referring Physician (Optometry)  Indicate any recent Medical Services you may have received from other than [facility] providers in the past year (date may be approximate).     Assessment:   This is a routine wellness examination for Jorge Benton.  Hearing/Vision screen Hearing Screening - Comments:: No issues Vision Screening - Comments:: Last exam 01/2021, Dr. Roderic Palau, wears glasses  Dietary issues and exercise activities discussed: Current Exercise Habits:  Structured exercise class, Type of exercise: Other - see comments;strength training/weights (cardio , stationary bike), Time (Minutes): 60, Frequency (Times/Week): 2, Weekly Exercise (Minutes/Week): 120, Intensity: Moderate   Goals Addressed             This Visit's Progress    Patient Stated       Would like to drink more water and increase exercise to 3 days a week. Would like to lose 10lb within the next year.        Depression Screen PHQ 2/9 Scores 10/24/2021 10/21/2020 10/06/2019 09/26/2018 09/23/2017 09/22/2016  PHQ - 2 Score 0 0 0 0 0 0  PHQ- 9 Score - - 0 0 0 -    Fall Risk Fall Risk  10/24/2021 10/21/2020 10/11/2019 10/06/2019 09/26/2018  Falls in the past year? 0 0 0 0 0  Comment - - Emmi Telephone Survey: data to providers prior to load - -  Number falls in past yr: 0 0 - 0 -  Injury with Fall? 0 0 - 0 -  Risk for fall due to : No Fall Risks - - - -  Follow up Falls prevention discussed Falls evaluation completed - Falls evaluation completed;Falls prevention discussed -    FALL RISK PREVENTION PERTAINING TO THE HOME:  Any stairs in or around the home? No  If so, are there any without handrails? No  Home free of loose throw rugs in walkways, pet beds, electrical cords, etc? Yes  Adequate lighting in your home to reduce risk of falls? Yes   ASSISTIVE DEVICES UTILIZED TO PREVENT FALLS:  Life alert? No  Use of a cane, walker or w/c? No  Grab bars in the bathroom? No  Shower chair or bench in shower? Yes  Elevated toilet seat or a handicapped toilet? Yes   TIMED UP AND GO:  Was the test performed? No , visit completed over the phone    Cognitive Function: Normal cognitive status assessed bythis Nurse Health Advisor. No abnormalities found.   MMSE - Mini Mental State Exam 10/06/2019 09/26/2018 09/23/2017  Orientation to time 5 5 5   Orientation  to Place 5 5 5   Registration 3 3 3   Attention/ Calculation 5 0 0  Recall 3 3 3   Language- name 2 objects - 0 0   Language- repeat 1 1 1   Language- follow 3 step command - 3 3  Language- read & follow direction - 0 0  Write a sentence - 0 0  Copy design - 0 0  Total score - 20 20        Immunizations Immunization History  Administered Date(s) Administered   Fluad Quad(high Dose 65+) 10/16/2019, 10/21/2020   Influenza,inj,Quad PF,6+ Mos 09/12/2013, 09/13/2014, 09/16/2015, 09/22/2016, 10/01/2017, 09/26/2018   PFIZER(Purple Top)SARS-COV-2 Vaccination 12/05/2019, 12/25/2019, 08/16/2020   Pneumococcal Conjugate-13 09/22/2016   Pneumococcal Polysaccharide-23 10/01/2017   Td 07/24/2003   Tdap 09/12/2013   Zoster, Live 05/02/2012    TDAP status: Up to date  Flu Vaccine status: Due, Education has been provided regarding the importance of this vaccine. Advised may receive this vaccine at local pharmacy or Health Dept. Aware to provide a copy of the vaccination record if obtained from local pharmacy or Health Dept. Verbalized acceptance and understanding.  Pneumococcal vaccine status: Up to date  Covid-19 vaccine status: Information provided on how to obtain vaccines.   Qualifies for Shingles Vaccine? Yes   Zostavax completed Yes   Shingrix Completed?: No.    Education has been provided regarding the importance of this vaccine. Patient has been advised to call insurance company to determine out of pocket expense if they have not yet received this vaccine. Advised may also receive vaccine at local pharmacy or Health Dept. Verbalized acceptance and understanding.  Screening Tests Health Maintenance  Topic Date Due   Zoster Vaccines- Shingrix (1 of 2) Never done   COVID-19 Vaccine (4 - Booster for Pfizer series) 10/11/2020   INFLUENZA VACCINE  06/16/2021   TETANUS/TDAP  09/13/2023   COLONOSCOPY (Pts 45-41yrs Insurance coverage will need to be confirmed)  01/20/2026   Pneumonia Vaccine 28+ Years old  Completed   Hepatitis C Screening  Completed   HPV VACCINES  Aged Out    Health  Maintenance  Health Maintenance Due  Topic Date Due   Zoster Vaccines- Shingrix (1 of 2) Never done   COVID-19 Vaccine (4 - Booster for Pfizer series) 10/11/2020   INFLUENZA VACCINE  06/16/2021    Colorectal cancer screening: Type of screening: Colonoscopy. Completed 01/20/21. Repeat every 5 years  Lung Cancer Screening: (Low Dose CT Chest recommended if Age 57-80 years, 30 pack-year currently smoking OR have quit w/in 15years.) does qualify, lung CT scan completed 12/11/20    Additional Screening:  Hepatitis C Screening: does qualify; Completed 09/15/16  Vision Screening: Recommended annual ophthalmology exams for early detection of glaucoma and other disorders of the eye. Is the patient up to date with their annual eye exam?  Yes  Who is the provider or what is the name of the office in which the patient attends annual eye exams? Dr. Roderic Palau   Dental Screening: Recommended annual dental exams for proper oral hygiene  Community Resource Referral / Chronic Care Management: CRR required this visit?  No   CCM required this visit?  No      Plan:     I have personally reviewed and noted the following in the patient's chart:   Medical and social history Use of alcohol, tobacco or illicit drugs  Current medications and supplements including opioid prescriptions. Patient is not currently taking opioid prescriptions. Functional ability and status Nutritional status Physical activity  Advanced directives List of other physicians Hospitalizations, surgeries, and ER visits in previous 12 months Vitals Screenings to include cognitive, depression, and falls Referrals and appointments  In addition, I have reviewed and discussed with patient certain preventive protocols, quality metrics, and best practice recommendations. A written personalized care plan for preventive services as well as general preventive health recommendations were provided to patient.   Due to this being a  telephonic visit, the after visit summary with patients personalized plan was offered to patient via mail or my-chart. Patient would like to access on my-chart.  Loma Messing, LPN   [id number]   Nurse Health Advisor  Nurse Notes: none

## 2021-10-24 ENCOUNTER — Ambulatory Visit (INDEPENDENT_AMBULATORY_CARE_PROVIDER_SITE_OTHER): Payer: Medicare Other

## 2021-10-24 ENCOUNTER — Other Ambulatory Visit (INDEPENDENT_AMBULATORY_CARE_PROVIDER_SITE_OTHER): Payer: Medicare Other

## 2021-10-24 ENCOUNTER — Other Ambulatory Visit: Payer: Self-pay

## 2021-10-24 VITALS — Ht 71.0 in | Wt 277.0 lb

## 2021-10-24 DIAGNOSIS — E785 Hyperlipidemia, unspecified: Secondary | ICD-10-CM | POA: Diagnosis not present

## 2021-10-24 DIAGNOSIS — R739 Hyperglycemia, unspecified: Secondary | ICD-10-CM

## 2021-10-24 DIAGNOSIS — Z0000 Encounter for general adult medical examination without abnormal findings: Secondary | ICD-10-CM

## 2021-10-24 LAB — LIPID PANEL
Cholesterol: 140 mg/dL (ref 0–200)
HDL: 35.7 mg/dL — ABNORMAL LOW (ref >39.00)
LDL Cholesterol: 86 mg/dL (ref 0–99)
NonHDL: 103.9
Total CHOL/HDL Ratio: 4
Triglycerides: 91 mg/dL (ref 0.0–149.0)
VLDL: 18.2 mg/dL (ref 0.0–40.0)

## 2021-10-24 LAB — COMPREHENSIVE METABOLIC PANEL
ALT: 19 U/L (ref 0–53)
AST: 20 U/L (ref 0–37)
Albumin: 3.7 g/dL (ref 3.5–5.2)
Alkaline Phosphatase: 68 U/L (ref 39–117)
BUN: 16 mg/dL (ref 6–23)
CO2: 27 mEq/L (ref 19–32)
Calcium: 8.9 mg/dL (ref 8.4–10.5)
Chloride: 105 mEq/L (ref 96–112)
Creatinine, Ser: 0.98 mg/dL (ref 0.40–1.50)
GFR: 78.34 mL/min (ref >60.00)
Glucose, Bld: 103 mg/dL — ABNORMAL HIGH (ref 70–99)
Potassium: 3.9 mEq/L (ref 3.5–5.1)
Sodium: 140 mEq/L (ref 135–145)
Total Bilirubin: 0.3 mg/dL (ref 0.2–1.2)
Total Protein: 6.6 g/dL (ref 6.0–8.3)

## 2021-10-24 LAB — HEMOGLOBIN A1C: Hgb A1c MFr Bld: 5.8 % (ref 4.6–6.5)

## 2021-10-24 NOTE — Patient Instructions (Signed)
Jorge Benton , Thank you for taking time to complete your Medicare Wellness Visit. I appreciate your ongoing commitment to your health goals. Please review the following plan we discussed and let me know if I can assist you in the future.   Screening recommendations/referrals: Colonoscopy: up to date , completed 01/20/21, due 01/20/26 Recommended yearly ophthalmology/optometry visit for glaucoma screening and checkup Recommended yearly dental visit for hygiene and checkup  Vaccinations: Influenza vaccine: Due-May obtain vaccine at our office or your local pharmacy. Pneumococcal vaccine: up to date Tdap vaccine: up to date, completed 09/12/13, due 09/13/23 Shingles vaccine: Discuss with your local pharmacy   Covid-19: newest booster available at your local pharmacy  Advanced directives: Please bring a copy of Living Will and/or [place] for your chart.   Conditions/risks identified: see problem list  Next appointment: Follow up in one year for your annual wellness visit.   Preventive Care 39 Years and Older, Male Preventive care refers to lifestyle choices and visits with your health care provider that can promote health and wellness. What does preventive care include? A yearly physical exam. This is also called an annual well check. Dental exams once or twice a year. Routine eye exams. Ask your health care provider how often you should have your eyes checked. Personal lifestyle choices, including: Daily care of your teeth and gums. Regular physical activity. Eating a healthy diet. Avoiding tobacco and drug use. Limiting alcohol use. Practicing safe sex. Taking low doses of aspirin every day. Taking vitamin and mineral supplements as recommended by your health care provider. What happens during an annual well check? The services and screenings done by your health care provider during your annual well check will depend on your age, overall health, lifestyle risk  factors, and family history of disease. Counseling  Your health care provider may ask you questions about your: Alcohol use. Tobacco use. Drug use. Emotional well-being. Home and relationship well-being. Sexual activity. Eating habits. History of falls. Memory and ability to understand (cognition). Work and work Statistician. Screening  You may have the following tests or measurements: Height, weight, and BMI. Blood pressure. Lipid and cholesterol levels. These may be checked every 5 years, or more frequently if you are over 36 years old. Skin check. Lung cancer screening. You may have this screening every year starting at age 6 if you have a 30-pack-year history of smoking and currently smoke or have quit within the past 15 years. Fecal occult blood test (FOBT) of the stool. You may have this test every year starting at age 79. Flexible sigmoidoscopy or colonoscopy. You may have a sigmoidoscopy every 5 years or a colonoscopy every 10 years starting at age 14. Prostate cancer screening. Recommendations will vary depending on your family history and other risks. Hepatitis C blood test. Hepatitis B blood test. Sexually transmitted disease (STD) testing. Diabetes screening. This is done by checking your blood sugar (glucose) after you have not eaten for a while (fasting). You may have this done every 1-3 years. Abdominal aortic aneurysm (AAA) screening. You may need this if you are a current or former smoker. Osteoporosis. You may be screened starting at age 61 if you are at high risk. Talk with your health care provider about your test results, treatment options, and if necessary, the need for more tests. Vaccines  Your health care provider may recommend certain vaccines, such as: Influenza vaccine. This is recommended every year. Tetanus, diphtheria, and acellular pertussis (Tdap, Td) vaccine. You may need  a Td booster every 10 years. Zoster vaccine. You may need this after age  60. Pneumococcal 13-valent conjugate (PCV13) vaccine. One dose is recommended after age 72. Pneumococcal polysaccharide (PPSV23) vaccine. One dose is recommended after age 78. Talk to your health care provider about which screenings and vaccines you need and how often you need them. This information is not intended to replace advice given to you by your health care provider. Make sure you discuss any questions you have with your health care provider. Document Released: 11/29/2015 Document Revised: 07/22/2016 Document Reviewed: 09/03/2015 Elsevier Interactive Patient Education  2017 [place] Prevention in the Home Falls can cause injuries. They can happen to people of all ages. There are many things you can do to make your home safe and to help prevent falls. What can I do on the outside of my home? Regularly fix the edges of walkways and driveways and fix any cracks. Remove anything that might make you trip as you walk through a door, such as a raised step or threshold. Trim any bushes or trees on the path to your home. Use bright outdoor lighting. Clear any walking paths of anything that might make someone trip, such as rocks or tools. Regularly check to see if handrails are loose or broken. Make sure that both sides of any steps have handrails. Any raised decks and porches should have guardrails on the edges. Have any leaves, snow, or ice cleared regularly. Use sand or salt on walking paths during winter. Clean up any spills in your garage right away. This includes oil or grease spills. What can I do in the bathroom? Use night lights. Install grab bars by the toilet and in the tub and shower. Do not use towel bars as grab bars. Use non-skid mats or decals in the tub or shower. If you need to sit down in the shower, use a plastic, non-slip stool. Keep the floor dry. Clean up any water that spills on the floor as soon as it happens. Remove soap buildup in the tub or shower  regularly. Attach bath mats securely with double-sided non-slip rug tape. Do not have throw rugs and other things on the floor that can make you trip. What can I do in the bedroom? Use night lights. Make sure that you have a light by your bed that is easy to reach. Do not use any sheets or blankets that are too big for your bed. They should not hang down onto the floor. Have a firm chair that has side arms. You can use this for support while you get dressed. Do not have throw rugs and other things on the floor that can make you trip. What can I do in the kitchen? Clean up any spills right away. Avoid walking on wet floors. Keep items that you use a lot in easy-to-reach places. If you need to reach something above you, use a strong step stool that has a grab bar. Keep electrical cords out of the way. Do not use floor polish or wax that makes floors slippery. If you must use wax, use non-skid floor wax. Do not have throw rugs and other things on the floor that can make you trip. What can I do with my stairs? Do not leave any items on the stairs. Make sure that there are handrails on both sides of the stairs and use them. Fix handrails that are broken or loose. Make sure that handrails are as long as the stairways. Check  any carpeting to make sure that it is firmly attached to the stairs. Fix any carpet that is loose or worn. Avoid having throw rugs at the top or bottom of the stairs. If you do have throw rugs, attach them to the floor with carpet tape. Make sure that you have a light switch at the top of the stairs and the bottom of the stairs. If you do not have them, ask someone to add them for you. What else can I do to help prevent falls? Wear shoes that: Do not have high heels. Have rubber bottoms. Are comfortable and fit you well. Are closed at the toe. Do not wear sandals. If you use a stepladder: Make sure that it is fully opened. Do not climb a closed stepladder. Make sure that  both sides of the stepladder are locked into place. Ask someone to hold it for you, if possible. Clearly mark and make sure that you can see: Any grab bars or handrails. First and last steps. Where the edge of each step is. Use tools that help you move around (mobility aids) if they are needed. These include: Canes. Walkers. Scooters. Crutches. Turn on the lights when you go into a dark area. Replace any light bulbs as soon as they burn out. Set up your furniture so you have a clear path. Avoid moving your furniture around. If any of your floors are uneven, fix them. If there are any pets around you, be aware of where they are. Review your medicines with your doctor. Some medicines can make you feel dizzy. This can increase your chance of falling. Ask your doctor what other things that you can do to help prevent falls. This information is not intended to replace advice given to you by your health care provider. Make sure you discuss any questions you have with your health care provider. Document Released: 08/29/2009 Document Revised: 04/09/2016 Document Reviewed: 12/07/2014 Elsevier Interactive Patient Education  2017 [employer].

## 2021-10-30 ENCOUNTER — Ambulatory Visit: Payer: Medicare Other | Admitting: Family Medicine

## 2021-11-14 ENCOUNTER — Encounter: Payer: Self-pay | Admitting: Family Medicine

## 2021-11-14 ENCOUNTER — Other Ambulatory Visit: Payer: Self-pay

## 2021-11-14 ENCOUNTER — Ambulatory Visit (INDEPENDENT_AMBULATORY_CARE_PROVIDER_SITE_OTHER): Payer: Medicare Other | Admitting: Family Medicine

## 2021-11-14 VITALS — BP 118/80 | HR 90 | Temp 97.8°F | Ht 71.0 in | Wt 280.0 lb

## 2021-11-14 DIAGNOSIS — E785 Hyperlipidemia, unspecified: Secondary | ICD-10-CM

## 2021-11-14 DIAGNOSIS — M7700 Medial epicondylitis, unspecified elbow: Secondary | ICD-10-CM

## 2021-11-14 DIAGNOSIS — R739 Hyperglycemia, unspecified: Secondary | ICD-10-CM

## 2021-11-14 DIAGNOSIS — Z0000 Encounter for general adult medical examination without abnormal findings: Secondary | ICD-10-CM

## 2021-11-14 DIAGNOSIS — Z23 Encounter for immunization: Secondary | ICD-10-CM

## 2021-11-14 DIAGNOSIS — I2510 Atherosclerotic heart disease of native coronary artery without angina pectoris: Secondary | ICD-10-CM

## 2021-11-14 DIAGNOSIS — Z7189 Other specified counseling: Secondary | ICD-10-CM

## 2021-11-14 DIAGNOSIS — Z7184 Encounter for health counseling related to travel: Secondary | ICD-10-CM

## 2021-11-14 MED ORDER — PRAVASTATIN SODIUM 10 MG PO TABS: 10.0000 mg | ORAL_TABLET | Freq: Every day | ORAL | Status: DC

## 2021-11-14 MED ORDER — DOXYCYCLINE HYCLATE 100 MG PO TABS: 100.0000 mg | ORAL_TABLET | Freq: Every day | ORAL | 0 refills | Status: DC

## 2021-11-14 MED ORDER — ONDANSETRON HCL 4 MG PO TABS: 4.0000 mg | ORAL_TABLET | Freq: Three times a day (TID) | ORAL | 0 refills | Status: DC | PRN

## 2021-11-14 MED ORDER — VICTOZA 18 MG/3ML ~~LOC~~ SOPN: PEN_INJECTOR | SUBCUTANEOUS | 2 refills | Status: DC

## 2021-11-14 NOTE — Progress Notes (Signed)
This visit occurred during the SARS-CoV-2 public health emergency.  Safety protocols were in place, including screening questions prior to the visit, additional usage of staff PPE, and extensive cleaning of exam room while observing appropriate contact time as indicated for disinfecting solutions.  Elevated Cholesterol/CAD.  Using medications without problems: yes Muscle aches: no aches at pravastatin 10mg  but had aches at 20mg , d/w pt.   Diet compliance: yes Exercise: yes Labs d/w pt.  Incidental CAD noted.  No CP, not SOB.  Still able to exercise w/o troubles.    R elbow pain after golfing.  R handed.  Medial elbow pain.  No bruising.  Has improved in the meantime.  Travelling to [place] in [place].  D/w pt about doxy use for malaria proph.  He can use zofran in case of nausea.  CDC information relative to his destination discussed.  Hyperglycemia.  Not diabetic by A1c.  Labs d/w pt.  No abd pain.  Weight down about 7% from last year.  He is working on diet and exercise.  He is going to the [facility].  He noted a change in taste that could be GERD related.  No vomiting.  Metallic taste.    Flu vaccine - 2022 PNA up to date Tdap 2014 shingrix d/w pt. covid vaccine 2021, d/w pt.  Colonoscopy 2022 Prostate cancer screening and PSA options (with potential risks and benefits of testing vs not testing) were discussed along with recent recs/guidelines.  He declined testing PSA at this point. AAA screen prev done.   Advance directive-wife designated if patient were incapacitated. Lung cancer screening program d/w pt. Not yet due for f/u screening as of 10/2021  Meds, vitals, and allergies reviewed.   ROS: Per HPI unless specifically indicated in ROS section   GEN: nad, alert and oriented HEENT: ncat NECK: supple w/o LA CV: rrr. PULM: ctab, no inc wob ABD: soft, +bs EXT: no edema SKIN: well perfused.  Right medial elbow slightly tender without lateral tenderness or tenderness at the  olecranon.  Distally neurovascularly intact.  Normal range of motion at the elbow.  32 minutes were devoted to patient care in this encounter (this includes time spent reviewing the patient's file/history, interviewing and examining the patient, counseling/reviewing plan with patient).

## 2021-11-14 NOTE — Patient Instructions (Addendum)
I would take doxy for malaria protection.  Ice your elbow for 5 minutes at a time.  You could skip victoza for about 1 week and see if the taste changes improve.  Either way, let me know.  Take care.  Glad to see you.

## 2021-11-17 DIAGNOSIS — M7700 Medial epicondylitis, unspecified elbow: Secondary | ICD-10-CM | POA: Insufficient documentation

## 2021-11-17 DIAGNOSIS — Z7184 Encounter for health counseling related to travel: Secondary | ICD-10-CM | POA: Insufficient documentation

## 2021-11-17 NOTE — Assessment & Plan Note (Signed)
Not diabetic based on A1c.  Continue work on diet and exercise.  He has noted some taste changes.  I asked him to hold Victoza for about 1 week and see if that resolved.  He can update me as needed.

## 2021-11-17 NOTE — Assessment & Plan Note (Signed)
Would continue pravastatin at 10 mg as that appears to be the maximum dose he can tolerate.  Continue work on diet and exercise.

## 2021-11-17 NOTE — Assessment & Plan Note (Signed)
Flu vaccine - 2022 PNA up to date Tdap 2014 shingrix d/w pt. covid vaccine 2021, d/w pt.  Colonoscopy 2022 Prostate cancer screening and PSA options (with potential risks and benefits of testing vs not testing) were discussed along with recent recs/guidelines.  He declined testing PSA at this point. AAA screen prev done.   Advance directive-wife designated if patient were incapacitated. Lung cancer screening program d/w pt. Not yet due for f/u screening as of 10/2021

## 2021-11-17 NOTE — Assessment & Plan Note (Signed)
Advance directive- wife designated if patient were incapacitated.  

## 2021-11-17 NOTE — Assessment & Plan Note (Signed)
Reasonable to use doxycycline for malaria prophylaxis.  Discussed that he can use Pepto-Bismol as prophylaxis for traveler's diarrhea.  Prescription given for Zofran in case of nausea.  Routine cautions given to patient.

## 2021-11-17 NOTE — Assessment & Plan Note (Signed)
Discussed icing and relative return to exercise.  He can update me as needed.

## 2022-01-22 ENCOUNTER — Telehealth: Payer: Self-pay | Admitting: *Deleted

## 2022-01-22 ENCOUNTER — Other Ambulatory Visit: Payer: Self-pay | Admitting: *Deleted

## 2022-01-22 DIAGNOSIS — Z87891 Personal history of nicotine dependence: Secondary | ICD-10-CM

## 2022-01-22 NOTE — Telephone Encounter (Signed)
Left message for pt to call to schedule f/u lung screening CT scan.  ?

## 2022-01-23 DIAGNOSIS — H2513 Age-related nuclear cataract, bilateral: Secondary | ICD-10-CM | POA: Diagnosis not present

## 2022-02-02 DIAGNOSIS — D485 Neoplasm of uncertain behavior of skin: Secondary | ICD-10-CM | POA: Diagnosis not present

## 2022-02-02 DIAGNOSIS — L570 Actinic keratosis: Secondary | ICD-10-CM | POA: Diagnosis not present

## 2022-02-02 DIAGNOSIS — D225 Melanocytic nevi of trunk: Secondary | ICD-10-CM | POA: Diagnosis not present

## 2022-02-02 DIAGNOSIS — D2272 Melanocytic nevi of left lower limb, including hip: Secondary | ICD-10-CM | POA: Diagnosis not present

## 2022-02-02 DIAGNOSIS — C44619 Basal cell carcinoma of skin of left upper limb, including shoulder: Secondary | ICD-10-CM | POA: Diagnosis not present

## 2022-02-02 DIAGNOSIS — Z85820 Personal history of malignant melanoma of skin: Secondary | ICD-10-CM | POA: Diagnosis not present

## 2022-02-02 DIAGNOSIS — D2261 Melanocytic nevi of right upper limb, including shoulder: Secondary | ICD-10-CM | POA: Diagnosis not present

## 2022-02-02 DIAGNOSIS — Z85828 Personal history of other malignant neoplasm of skin: Secondary | ICD-10-CM | POA: Diagnosis not present

## 2022-02-02 DIAGNOSIS — D2271 Melanocytic nevi of right lower limb, including hip: Secondary | ICD-10-CM | POA: Diagnosis not present

## 2022-02-12 ENCOUNTER — Ambulatory Visit
Admission: RE | Admit: 2022-02-12 | Discharge: 2022-02-12 | Disposition: A | Discharge status: Home / Self Care | Payer: Medicare Other | Source: Ambulatory Visit | Attending: Acute Care | Admitting: Acute Care

## 2022-02-12 DIAGNOSIS — J432 Centrilobular emphysema: Secondary | ICD-10-CM | POA: Diagnosis not present

## 2022-02-12 DIAGNOSIS — Z87891 Personal history of nicotine dependence: Secondary | ICD-10-CM

## 2022-02-12 DIAGNOSIS — I700 Atherosclerosis of aorta: Secondary | ICD-10-CM | POA: Diagnosis not present

## 2022-02-12 DIAGNOSIS — I2510 Atherosclerotic heart disease of native coronary artery without angina pectoris: Secondary | ICD-10-CM | POA: Diagnosis not present

## 2022-02-13 ENCOUNTER — Other Ambulatory Visit: Payer: Self-pay | Admitting: Acute Care

## 2022-02-13 DIAGNOSIS — Z87891 Personal history of nicotine dependence: Secondary | ICD-10-CM

## 2022-03-03 ENCOUNTER — Other Ambulatory Visit: Payer: Self-pay | Admitting: Family Medicine

## 2022-03-03 DIAGNOSIS — E785 Hyperlipidemia, unspecified: Secondary | ICD-10-CM

## 2022-03-04 DIAGNOSIS — C44619 Basal cell carcinoma of skin of left upper limb, including shoulder: Secondary | ICD-10-CM | POA: Diagnosis not present

## 2022-04-20 ENCOUNTER — Other Ambulatory Visit: Payer: Self-pay | Admitting: Family Medicine

## 2022-04-28 ENCOUNTER — Telehealth: Payer: Self-pay | Admitting: Family Medicine

## 2022-04-28 MED ORDER — PEN NEEDLES 30G X 5 MM MISC: 1 refills | Status: DC

## 2022-04-28 NOTE — Telephone Encounter (Signed)
Erx sent and patient has been notified.  

## 2022-04-28 NOTE — Addendum Note (Signed)
Addended by: Sherrilee Gilles B on: 04/28/2022 11:06 AM   Modules accepted: Orders

## 2022-04-28 NOTE — Telephone Encounter (Signed)
Patient needs refill on pen needles 5 MM. He uses [facility] [address]. He is completely out and having to use his wife's supply of needles.

## 2022-07-08 ENCOUNTER — Other Ambulatory Visit: Payer: Self-pay | Admitting: Family Medicine

## 2022-07-08 DIAGNOSIS — E785 Hyperlipidemia, unspecified: Secondary | ICD-10-CM

## 2022-08-06 DIAGNOSIS — Z85828 Personal history of other malignant neoplasm of skin: Secondary | ICD-10-CM | POA: Diagnosis not present

## 2022-08-06 DIAGNOSIS — D225 Melanocytic nevi of trunk: Secondary | ICD-10-CM | POA: Diagnosis not present

## 2022-08-06 DIAGNOSIS — D2261 Melanocytic nevi of right upper limb, including shoulder: Secondary | ICD-10-CM | POA: Diagnosis not present

## 2022-08-06 DIAGNOSIS — L570 Actinic keratosis: Secondary | ICD-10-CM | POA: Diagnosis not present

## 2022-08-06 DIAGNOSIS — D2272 Melanocytic nevi of left lower limb, including hip: Secondary | ICD-10-CM | POA: Diagnosis not present

## 2022-08-06 DIAGNOSIS — Z85820 Personal history of malignant melanoma of skin: Secondary | ICD-10-CM | POA: Diagnosis not present

## 2022-08-13 ENCOUNTER — Other Ambulatory Visit: Payer: Self-pay | Admitting: Family Medicine

## 2022-08-13 DIAGNOSIS — E785 Hyperlipidemia, unspecified: Secondary | ICD-10-CM

## 2022-08-18 ENCOUNTER — Telehealth: Payer: Self-pay | Admitting: Family Medicine

## 2022-08-18 NOTE — Telephone Encounter (Signed)
Pt called in stated unable to get RX  liraglutide (VICTOZA) 18 MG/3ML SOPN in the 2 pack stared pharmacy only has the 3 pack wants to know can his prescription be change to a 3 pack . Please advise [phone]

## 2022-08-19 MED ORDER — VICTOZA 18 MG/3ML ~~LOC~~ SOPN: PEN_INJECTOR | SUBCUTANEOUS | 1 refills | Status: DC

## 2022-08-19 NOTE — Telephone Encounter (Signed)
Patient has been notified rx was changed.

## 2022-08-19 NOTE — Telephone Encounter (Signed)
Sent. Thanks.   

## 2022-08-19 NOTE — Addendum Note (Signed)
Addended by: Tonia Ghent on: 08/19/2022 01:22 PM   Modules accepted: Orders

## 2022-09-15 ENCOUNTER — Other Ambulatory Visit: Payer: Self-pay | Admitting: Family Medicine

## 2022-11-01 ENCOUNTER — Other Ambulatory Visit: Payer: Self-pay | Admitting: Family Medicine

## 2022-11-01 DIAGNOSIS — R739 Hyperglycemia, unspecified: Secondary | ICD-10-CM

## 2022-11-01 DIAGNOSIS — E785 Hyperlipidemia, unspecified: Secondary | ICD-10-CM

## 2022-11-03 ENCOUNTER — Ambulatory Visit (INDEPENDENT_AMBULATORY_CARE_PROVIDER_SITE_OTHER): Payer: Medicare Other

## 2022-11-03 VITALS — Wt 280.0 lb

## 2022-11-03 DIAGNOSIS — Z0000 Encounter for general adult medical examination without abnormal findings: Secondary | ICD-10-CM | POA: Diagnosis not present

## 2022-11-03 NOTE — Patient Instructions (Signed)
[name] , Thank you for taking time to come for your Medicare Wellness Visit. I appreciate your ongoing commitment to your health goals. Please review the following plan we discussed and let me know if I can assist you in the future.   These are the goals we discussed:  Goals      Increase physical activity     Starting 09/26/2018, I will continue to walk for 30 minutes 2-3 times per day and to ride stationary bike for 30 minutes twice weekly.      Patient Stated     10/06/2019, I will continue to exercise and work on losing weight     Patient Stated     Would like to drink more water and increase exercise to 3 days a week. Would like to lose 10lb within the next year.      Patient Stated     Lose more weight         This is a list of the screening recommended for you and due dates:  Health Maintenance  Topic Date Due   Zoster (Shingles) Vaccine (1 of 2) Never done   Flu Shot  06/16/2022   COVID-19 Vaccine (4 - 2023-24 season) 07/17/2022   Screening for Lung Cancer  02/13/2023   DTaP/Tdap/Td vaccine (3 - Td or Tdap) 09/13/2023   Medicare Annual Wellness Visit  11/04/2023   Colon Cancer Screening  01/20/2026   Pneumonia Vaccine  Completed   Hepatitis C Screening: USPSTF Recommendation to screen - Ages 18-79 yo.  Completed   HPV Vaccine  Aged Out    Advanced directives: Please bring a copy of your health care power of attorney and living will to the office at your convenience.  Conditions/risks identified: lose a little weight   Next appointment: Follow up in one year for your annual wellness visit.   Preventive Care 71 Years and Older, Male  Preventive care refers to lifestyle choices and visits with your health care provider that can promote health and wellness. What does preventive care include? A yearly physical exam. This is also called an annual well check. Dental exams once or twice a year. Routine eye exams. Ask your health care provider how often you should  have your eyes checked. Personal lifestyle choices, including: Daily care of your teeth and gums. Regular physical activity. Eating a healthy diet. Avoiding tobacco and drug use. Limiting alcohol use. Practicing safe sex. Taking low doses of aspirin every day. Taking vitamin and mineral supplements as recommended by your health care provider. What happens during an annual well check? The services and screenings done by your health care provider during your annual well check will depend on your age, overall health, lifestyle risk factors, and family history of disease. Counseling  Your health care provider may ask you questions about your: Alcohol use. Tobacco use. Drug use. Emotional well-being. Home and relationship well-being. Sexual activity. Eating habits. History of falls. Memory and ability to understand (cognition). Work and work Statistician. Screening  You may have the following tests or measurements: Height, weight, and BMI. Blood pressure. Lipid and cholesterol levels. These may be checked every 5 years, or more frequently if you are over 66 years old. Skin check. Lung cancer screening. You may have this screening every year starting at age 82 if you have a 30-pack-year history of smoking and currently smoke or have quit within the past 15 years. Fecal occult blood test (FOBT) of the stool. You may have this test every  year starting at age 53. Flexible sigmoidoscopy or colonoscopy. You may have a sigmoidoscopy every 5 years or a colonoscopy every 10 years starting at age 15. Prostate cancer screening. Recommendations will vary depending on your family history and other risks. Hepatitis C blood test. Hepatitis B blood test. Sexually transmitted disease (STD) testing. Diabetes screening. This is done by checking your blood sugar (glucose) after you have not eaten for a while (fasting). You may have this done every 1-3 years. Abdominal aortic aneurysm (AAA) screening. You  may need this if you are a current or former smoker. Osteoporosis. You may be screened starting at age 40 if you are at high risk. Talk with your health care provider about your test results, treatment options, and if necessary, the need for more tests. Vaccines  Your health care provider may recommend certain vaccines, such as: Influenza vaccine. This is recommended every year. Tetanus, diphtheria, and acellular pertussis (Tdap, Td) vaccine. You may need a Td booster every 10 years. Zoster vaccine. You may need this after age 47. Pneumococcal 13-valent conjugate (PCV13) vaccine. One dose is recommended after age 29. Pneumococcal polysaccharide (PPSV23) vaccine. One dose is recommended after age 71. Talk to your health care provider about which screenings and vaccines you need and how often you need them. This information is not intended to replace advice given to you by your health care provider. Make sure you discuss any questions you have with your health care provider. Document Released: 11/29/2015 Document Revised: 07/22/2016 Document Reviewed: 09/03/2015 Elsevier Interactive Patient Education  2017 [place] Prevention in the Home Falls can cause injuries. They can happen to people of all ages. There are many things you can do to make your home safe and to help prevent falls. What can I do on the outside of my home? Regularly fix the edges of walkways and driveways and fix any cracks. Remove anything that might make you trip as you walk through a door, such as a raised step or threshold. Trim any bushes or trees on the path to your home. Use bright outdoor lighting. Clear any walking paths of anything that might make someone trip, such as rocks or tools. Regularly check to see if handrails are loose or broken. Make sure that both sides of any steps have handrails. Any raised decks and porches should have guardrails on the edges. Have any leaves, snow, or ice cleared  regularly. Use sand or salt on walking paths during winter. Clean up any spills in your garage right away. This includes oil or grease spills. What can I do in the bathroom? Use night lights. Install grab bars by the toilet and in the tub and shower. Do not use towel bars as grab bars. Use non-skid mats or decals in the tub or shower. If you need to sit down in the shower, use a plastic, non-slip stool. Keep the floor dry. Clean up any water that spills on the floor as soon as it happens. Remove soap buildup in the tub or shower regularly. Attach bath mats securely with double-sided non-slip rug tape. Do not have throw rugs and other things on the floor that can make you trip. What can I do in the bedroom? Use night lights. Make sure that you have a light by your bed that is easy to reach. Do not use any sheets or blankets that are too big for your bed. They should not hang down onto the floor. Have a firm chair that has side  arms. You can use this for support while you get dressed. Do not have throw rugs and other things on the floor that can make you trip. What can I do in the kitchen? Clean up any spills right away. Avoid walking on wet floors. Keep items that you use a lot in easy-to-reach places. If you need to reach something above you, use a strong step stool that has a grab bar. Keep electrical cords out of the way. Do not use floor polish or wax that makes floors slippery. If you must use wax, use non-skid floor wax. Do not have throw rugs and other things on the floor that can make you trip. What can I do with my stairs? Do not leave any items on the stairs. Make sure that there are handrails on both sides of the stairs and use them. Fix handrails that are broken or loose. Make sure that handrails are as long as the stairways. Check any carpeting to make sure that it is firmly attached to the stairs. Fix any carpet that is loose or worn. Avoid having throw rugs at the top or  bottom of the stairs. If you do have throw rugs, attach them to the floor with carpet tape. Make sure that you have a light switch at the top of the stairs and the bottom of the stairs. If you do not have them, ask someone to add them for you. What else can I do to help prevent falls? Wear shoes that: Do not have high heels. Have rubber bottoms. Are comfortable and fit you well. Are closed at the toe. Do not wear sandals. If you use a stepladder: Make sure that it is fully opened. Do not climb a closed stepladder. Make sure that both sides of the stepladder are locked into place. Ask someone to hold it for you, if possible. Clearly mark and make sure that you can see: Any grab bars or handrails. First and last steps. Where the edge of each step is. Use tools that help you move around (mobility aids) if they are needed. These include: Canes. Walkers. Scooters. Crutches. Turn on the lights when you go into a dark area. Replace any light bulbs as soon as they burn out. Set up your furniture so you have a clear path. Avoid moving your furniture around. If any of your floors are uneven, fix them. If there are any pets around you, be aware of where they are. Review your medicines with your doctor. Some medicines can make you feel dizzy. This can increase your chance of falling. Ask your doctor what other things that you can do to help prevent falls. This information is not intended to replace advice given to you by your health care provider. Make sure you discuss any questions you have with your health care provider. Document Released: 08/29/2009 Document Revised: 04/09/2016 Document Reviewed: 12/07/2014 Elsevier Interactive Patient Education  2017 [employer].

## 2022-11-03 NOTE — Progress Notes (Signed)
I connected with  Jorge Benton on 11/03/22 by a audio enabled telemedicine application and verified that I am speaking with the correct person using two identifiers.  Patient Location: Home  Provider Location: Office/Clinic  I discussed the limitations of evaluation and management by telemedicine. The patient expressed understanding and agreed to proceed.   Subjective:   Jorge Benton is a 71 y.o. male who presents for Medicare Annual/Subsequent preventive examination.  Review of Systems     Cardiac Risk Factors include: advanced age (>55mn, >>80women);dyslipidemia;male gender;obesity (BMI >30kg/m2)     Objective:    Today's Vitals   11/03/22 1148  Weight: 280 lb (127 kg)   Body mass index is 39.05 kg/m.     11/03/2022   11:52 AM 10/24/2021    8:21 AM 10/06/2019    9:19 AM 09/26/2018   10:25 AM 09/23/2017   11:10 AM  Advanced Directives  Does Patient Have a Medical Advance Directive? Yes Yes No No Yes  Type of [place]aramed[place]of [place] will [place] will   [place] will  Does patient want to make changes to medical advance directive?  Yes (MAU/Ambulatory/Procedural Areas - Information given)     Copy of HSandy Pointin Chart? No - copy requested    No - copy requested  Would patient like information on creating a medical advance directive?   Yes (MAU/Ambulatory/Procedural Areas - Information given) Yes (MAU/Ambulatory/Procedural Areas - Information given)     Current Medications (verified) Outpatient Encounter Medications as of 11/03/2022  Medication Sig   cetirizine (ZYRTEC) 10 MG tablet Take 10 mg by mouth daily as needed.   cholecalciferol (VITAMIN D3) 25 MCG (1000 UNIT) tablet Take 1,000 Units by mouth daily.   Cyanocobalamin (VITAMIN B 12 PO) Take by mouth.   Glucosamine 500 MG CAPS Take by mouth daily.   Green Tea, Camellia sinensis, (GREEN TEA EXTRACT PO)  Take 2 capsules by mouth daily.   ibuprofen (ADVIL,MOTRIN) 200 MG tablet Take 200 mg by mouth as needed.   Insulin Pen Needle (PEN NEEDLES) 30G X 5 MM MISC Use with victoza   liraglutide (VICTOZA) 18 MG/3ML SOPN ADMINISTER 0.6 MG UNDER THE SKIN DAILY FOR 7 DAYS THEN ADMINISTER 1.2 MG UNDER THE SKIN DAILY   Multiple Vitamins-Minerals (CENTRUM SILVER PO) Take by mouth daily.   ondansetron (ZOFRAN) 4 MG tablet Take 1 tablet (4 mg total) by mouth every 8 (eight) hours as needed for nausea or vomiting.   pravastatin (PRAVACHOL) 10 MG tablet TAKE 2 TABLETS BY MOUTH DAILY   Pyridoxine HCl (B-6 PO) Take 1 capsule by mouth daily.   doxycycline (VIBRA-TABS) 100 MG tablet Take 1 tablet (100 mg total) by mouth daily. Sun exposure caution   No facility-administered encounter medications on file as of 11/03/2022.    Allergies (verified) Tramadol   History: Past Medical History:  Diagnosis Date   Allergy    Blood transfusion without reported diagnosis 1967   with appendectomy    CAD (coronary artery disease)    Environmental allergies    HLD (hyperlipidemia)    Hyperlipidemia    [place] [facility]   [facility]  Sinusitis [facility]   severe in Hospital   Past Surgical History:  [place]   [facility]     [facility]- melanoma L thigh   POLYPECTOMY  SKIN SURGERY Left 01/2021   upper left chest   Family History  Problem Relation Age of Onset   Uterine cancer Mother        hysterectomy 20, chemo and radiation   Hypothyroidism Mother    Hypertension Mother    Heart disease Father        MI after surgery   Stroke Maternal Grandfather    Colon cancer Neg Hx    Prostate cancer Neg Hx    Pancreatic cancer Neg Hx    Stomach cancer Neg Hx    Colon polyps Neg Hx    Esophageal cancer Neg Hx    Rectal cancer Neg Hx    Social History   Socioeconomic History   Marital status: Married    Spouse name:  Not on file   Number of children: 3   Years of education: Not on file   Highest education level: Not on file  Occupational History   Occupation: Paediatric nurse  Tobacco Use   Smoking status: Former    Packs/day: 1.00    Years: 45.00    Total pack years: 45.00    Types: Cigarettes    Quit date: 2012    Years since quitting: 11.9   Smokeless tobacco: Never  Vaping Use   Vaping Use: Never used  Substance and Sexual Activity   Alcohol use: Yes    Alcohol/week: 1.0 standard drink of alcohol    Types: 1 Standard drinks or equivalent per week    Comment: cocktail   Drug use: No   Sexual activity: Not on file  Other Topics Concern   Not on file  Social History Narrative   Technical brewer   Married 1975   3 kids   Social Determinants of Health   Financial Resource Strain: Low Risk  (11/03/2022)   Overall Financial Resource Strain (CARDIA)    Difficulty of Paying Living Expenses: Not hard at all  Food Insecurity: No Food Insecurity (11/03/2022)   Hunger Vital Sign    Worried About Running Out of Food in the Last Year: Never true    Ran Out of Food in the Last Year: Never true  Transportation Needs: No Transportation Needs (11/03/2022)   PRAPARE - Hydrologist (Medical): No    Lack of Transportation (Non-Medical): No  Physical Activity: Sufficiently Active (11/03/2022)   Exercise Vital Sign    Days of Exercise per Week: 3 days    Minutes of Exercise per Session: 60 min  Stress: Stress Concern Present (11/03/2022)   [place]    Feeling of Stress : To some extent  Social Connections: Socially Integrated (11/03/2022)   Social Connection and Isolation Panel [NHANES]    Frequency of Communication with Friends and Family: More than three times a week    Frequency of Social Gatherings with Friends and Family: More than three times a week    Attends Religious Services: More than 4 times  per year    Active Member of [employer] or Organizations: Yes    Attends Archivist Meetings: 1 to 4 times per year    Marital Status: Married    Tobacco Counseling Counseling given: Not Answered   Clinical Intake:  Pre-visit preparation completed: Yes  Pain : No/denies pain     BMI - recorded: 39.05 Nutritional Status: BMI > 30  Obese Nutritional Risks: None Diabetes: No  How often do you need to have someone help  you when you read instructions, pamphlets, or other written materials from your doctor or pharmacy?: 1 - Never  Diabetic?no  Interpreter Needed?: No  Information entered by :: Charlott Rakes, LPN   Activities of Daily Living    11/03/2022   11:53 AM  In your present state of health, do you have any difficulty performing the following activities:  Hearing? 0  Vision? 0  Difficulty concentrating or making decisions? 0  Walking or climbing stairs? 0  Dressing or bathing? 0  Doing errands, shopping? 0  Preparing Food and eating ? N  Using the Toilet? N  In the past six months, have you accidently leaked urine? N  Do you have problems with loss of bowel control? N  Managing your Medications? N  Managing your Finances? N  Housekeeping or managing your Housekeeping? N    Patient Care Team: Tonia Ghent, MD as PCP - General (Family Medicine) Conception Chancy, DDS as Referring Physician (Dentistry) Eula Flax, OD as Referring Physician (Optometry)  Indicate any recent Medical Services you may have received from other than [facility] providers in the past year (date may be approximate).     Assessment:   This is a routine wellness examination for Lion.  Hearing/Vision screen Hearing Screening - Comments:: Pt wears hearing aids  Vision Screening - Comments:: Dr Leighton Roach for annul eye exams   Dietary issues and exercise activities discussed: Current Exercise Habits: Home exercise routine, Type of exercise: Other - see comments, Time (Minutes):  60, Frequency (Times/Week): 3, Weekly Exercise (Minutes/Week): 180   Goals Addressed             This Visit's Progress    Patient Stated       Lose more weight        Depression Screen    11/03/2022   11:51 AM 10/24/2021    8:24 AM 10/21/2020   10:08 AM 10/06/2019    9:21 AM 09/26/2018    3:49 PM 09/23/2017   11:10 AM 09/22/2016   11:01 AM  PHQ 2/9 Scores  PHQ - 2 Score 0 0 0 0 0 0 0  PHQ- 9 Score    0 0 0     Fall Risk    11/03/2022   11:53 AM 10/24/2021    8:23 AM 10/21/2020   10:08 AM 10/11/2019   11:33 AM 10/06/2019    9:20 AM  Fall Risk   Falls in the past year? 0 0 0 0 0  Comment    Emmi Telephone Survey: data to providers prior to load   Number falls in past yr: 0 0 0  0  Injury with Fall? 0 0 0  0  Risk for fall due to : Impaired vision No Fall Risks     Follow up Falls prevention discussed Falls prevention discussed Falls evaluation completed  Falls evaluation completed;Falls prevention discussed    FALL RISK PREVENTION PERTAINING TO THE HOME:  Any stairs in or around the home? No  If so, are there any without handrails? No  Home free of loose throw rugs in walkways, pet beds, electrical cords, etc? Yes  Adequate lighting in your home to reduce risk of falls? Yes   ASSISTIVE DEVICES UTILIZED TO PREVENT FALLS:  Life alert? No  Use of a cane, walker or w/c? No  Grab bars in the bathroom? No  Shower chair or bench in shower? No  Elevated toilet seat or a handicapped toilet? No   TIMED UP AND GO:  Was the test performed? No .   Cognitive Function:    10/06/2019    9:23 AM 09/26/2018    3:49 PM 09/23/2017   11:10 AM  MMSE - Mini Mental State Exam  Orientation to time '5 5 5  '$ Orientation to Place '5 5 5  '$ Registration '3 3 3  '$ Attention/ Calculation 5 0 0  Recall '3 3 3  '$ Language- name 2 objects  0 0  Language- repeat '1 1 1  '$ Language- follow 3 step command  3 3  Language- read & follow direction  0 0  Write a sentence  0 0  Copy design  0 0   Total score  20 20        11/03/2022   11:54 AM  6CIT Screen  What Year? 0 points  What month? 0 points  What time? 0 points  Count back from 20 0 points  Months in reverse 0 points  Repeat phrase 0 points  Total Score 0 points    Immunizations Immunization History  Administered Date(s) Administered   Fluad Quad(high Dose 65+) 10/16/2019, 10/21/2020, 11/14/2021   Influenza,inj,Quad PF,6+ Mos 09/12/2013, 09/13/2014, 09/16/2015, 09/22/2016, 10/01/2017, 09/26/2018   PFIZER(Purple Top)SARS-COV-2 Vaccination 12/05/2019, 12/25/2019, 08/16/2020   Pneumococcal Conjugate-13 09/22/2016   Pneumococcal Polysaccharide-23 10/01/2017   Td 07/24/2003   Tdap 09/12/2013   Zoster, Live 05/02/2012    TDAP status: Up to date  Flu Vaccine status: Due, Education has been provided regarding the importance of this vaccine. Advised may receive this vaccine at local pharmacy or Health Dept. Aware to provide a copy of the vaccination record if obtained from local pharmacy or Health Dept. Verbalized acceptance and understanding.  Pneumococcal vaccine status: Up to date  Covid-19 vaccine status: Completed vaccines  Qualifies for Shingles Vaccine? Yes   Zostavax completed No   Shingrix Completed?: No.    Education has been provided regarding the importance of this vaccine. Patient has been advised to call insurance company to determine out of pocket expense if they have not yet received this vaccine. Advised may also receive vaccine at local pharmacy or Health Dept. Verbalized acceptance and understanding.  Screening Tests Health Maintenance  Topic Date Due   Zoster Vaccines- Shingrix (1 of 2) Never done   INFLUENZA VACCINE  06/16/2022   COVID-19 Vaccine (4 - 2023-24 season) 07/17/2022   Lung Cancer Screening  02/13/2023   DTaP/Tdap/Td (3 - Td or Tdap) 09/13/2023   Medicare Annual Wellness (AWV)  11/04/2023   COLONOSCOPY (Pts 45-14yr Insurance coverage will need to be confirmed)  01/20/2026    Pneumonia Vaccine 71 Years old  Completed   Hepatitis C Screening  Completed   HPV VACCINES  Aged Out    Health Maintenance  Health Maintenance Due  Topic Date Due   Zoster Vaccines- Shingrix (1 of 2) Never done   INFLUENZA VACCINE  06/16/2022   COVID-19 Vaccine (4 - 2023-24 season) 07/17/2022    Colorectal cancer screening: Type of screening: Colonoscopy. Completed 01/20/21. Repeat every 5 years  Additional Screening:  Hepatitis C Screening:  Completed 09/15/16  Vision Screening: Recommended annual ophthalmology exams for early detection of glaucoma and other disorders of the eye. Is the patient up to date with their annual eye exam?  Yes  Who is the provider or what is the name of the office in which the patient attends annual eye exams? Dr OLeighton Roach If pt is not established with a provider, would they like to be referred to a provider to  establish care? No .   Dental Screening: Recommended annual dental exams for proper oral hygiene  Community Resource Referral / Chronic Care Management: CRR required this visit?  No   CCM required this visit?  No      Plan:     I have personally reviewed and noted the following in the patient's chart:   Medical and social history Use of alcohol, tobacco or illicit drugs  Current medications and supplements including opioid prescriptions. Patient is not currently taking opioid prescriptions. Functional ability and status Nutritional status Physical activity Advanced directives List of other physicians Hospitalizations, surgeries, and ER visits in previous 12 months Vitals Screenings to include cognitive, depression, and falls Referrals and appointments  In addition, I have reviewed and discussed with patient certain preventive protocols, quality metrics, and best practice recommendations. A written personalized care plan for preventive services as well as general preventive health recommendations were provided to patient.      Willette Brace, LPN   [id number]   Nurse Notes: none

## 2022-11-04 ENCOUNTER — Other Ambulatory Visit: Payer: Self-pay | Admitting: Family Medicine

## 2022-11-10 ENCOUNTER — Other Ambulatory Visit (INDEPENDENT_AMBULATORY_CARE_PROVIDER_SITE_OTHER): Payer: Medicare Other

## 2022-11-10 DIAGNOSIS — R739 Hyperglycemia, unspecified: Secondary | ICD-10-CM | POA: Diagnosis not present

## 2022-11-10 DIAGNOSIS — E785 Hyperlipidemia, unspecified: Secondary | ICD-10-CM

## 2022-11-10 LAB — LIPID PANEL
Cholesterol: 150 mg/dL (ref 0–200)
HDL: 34.9 mg/dL — ABNORMAL LOW (ref >39.00)
LDL Cholesterol: 94 mg/dL (ref 0–99)
NonHDL: 115.16
Total CHOL/HDL Ratio: 4
Triglycerides: 106 mg/dL (ref 0.0–149.0)
VLDL: 21.2 mg/dL (ref 0.0–40.0)

## 2022-11-10 LAB — COMPREHENSIVE METABOLIC PANEL
ALT: 18 U/L (ref 0–53)
AST: 17 U/L (ref 0–37)
Albumin: 3.8 g/dL (ref 3.5–5.2)
Alkaline Phosphatase: 81 U/L (ref 39–117)
BUN: 13 mg/dL (ref 6–23)
CO2: 29 mEq/L (ref 19–32)
Calcium: 8.7 mg/dL (ref 8.4–10.5)
Chloride: 104 mEq/L (ref 96–112)
Creatinine, Ser: 1.05 mg/dL (ref 0.40–1.50)
GFR: 71.59 mL/min (ref >60.00)
Glucose, Bld: 117 mg/dL — ABNORMAL HIGH (ref 70–99)
Potassium: 4.2 mEq/L (ref 3.5–5.1)
Sodium: 140 mEq/L (ref 135–145)
Total Bilirubin: 0.3 mg/dL (ref 0.2–1.2)
Total Protein: 6.3 g/dL (ref 6.0–8.3)

## 2022-11-10 LAB — CBC WITH DIFFERENTIAL/PLATELET
Basophils Absolute: 0 10*3/uL (ref 0.0–0.1)
Basophils Relative: 0.5 % (ref 0.0–3.0)
Eosinophils Absolute: 0.1 10*3/uL (ref 0.0–0.7)
Eosinophils Relative: 1.5 % (ref 0.0–5.0)
HCT: 42.2 % (ref 39.0–52.0)
Hemoglobin: 13.9 g/dL (ref 13.0–17.0)
Lymphocytes Relative: 19.5 % (ref 12.0–46.0)
Lymphs Abs: 1.4 10*3/uL (ref 0.7–4.0)
MCHC: 32.9 g/dL (ref 30.0–36.0)
MCV: 85.8 fl (ref 78.0–100.0)
Monocytes Absolute: 0.7 10*3/uL (ref 0.1–1.0)
Monocytes Relative: 10.3 % (ref 3.0–12.0)
Neutro Abs: 4.9 10*3/uL (ref 1.4–7.7)
Neutrophils Relative %: 68.2 % (ref 43.0–77.0)
Platelets: 235 10*3/uL (ref 150.0–400.0)
RBC: 4.92 Mil/uL (ref 4.22–5.81)
RDW: 15.1 % (ref 11.5–15.5)
WBC: 7.2 10*3/uL (ref 4.0–10.5)

## 2022-11-10 LAB — TSH: TSH: 4.36 u[IU]/mL (ref 0.35–5.50)

## 2022-11-10 LAB — HEMOGLOBIN A1C: Hgb A1c MFr Bld: 5.8 % (ref 4.6–6.5)

## 2022-11-17 ENCOUNTER — Ambulatory Visit (INDEPENDENT_AMBULATORY_CARE_PROVIDER_SITE_OTHER): Payer: Medicare Other | Admitting: Family Medicine

## 2022-11-17 ENCOUNTER — Encounter: Payer: Self-pay | Admitting: Family Medicine

## 2022-11-17 VITALS — BP 110/80 | HR 88 | Temp 97.2°F | Ht 71.0 in | Wt 289.0 lb

## 2022-11-17 DIAGNOSIS — R739 Hyperglycemia, unspecified: Secondary | ICD-10-CM | POA: Diagnosis not present

## 2022-11-17 DIAGNOSIS — R8290 Unspecified abnormal findings in urine: Secondary | ICD-10-CM

## 2022-11-17 DIAGNOSIS — Z85820 Personal history of malignant melanoma of skin: Secondary | ICD-10-CM

## 2022-11-17 DIAGNOSIS — Z0000 Encounter for general adult medical examination without abnormal findings: Secondary | ICD-10-CM

## 2022-11-17 DIAGNOSIS — Z7189 Other specified counseling: Secondary | ICD-10-CM

## 2022-11-17 DIAGNOSIS — E785 Hyperlipidemia, unspecified: Secondary | ICD-10-CM

## 2022-11-17 DIAGNOSIS — Z23 Encounter for immunization: Secondary | ICD-10-CM | POA: Diagnosis not present

## 2022-11-17 LAB — URINALYSIS, ROUTINE W REFLEX MICROSCOPIC
Bilirubin Urine: NEGATIVE
Hgb urine dipstick: NEGATIVE
Ketones, ur: NEGATIVE
Leukocytes,Ua: NEGATIVE
Nitrite: NEGATIVE
RBC / HPF: NONE SEEN (ref 0–?)
Specific Gravity, Urine: >=1.03 — AB (ref 1.000–1.030)
Total Protein, Urine: NEGATIVE
Urine Glucose: NEGATIVE
Urobilinogen, UA: 0.2 (ref 0.0–1.0)
WBC, UA: NONE SEEN (ref 0–?)
pH: 6 (ref 5.0–8.0)

## 2022-11-17 MED ORDER — PRAVASTATIN SODIUM 10 MG PO TABS: 10.0000 mg | ORAL_TABLET | Freq: Every day | ORAL | 3 refills | Status: DC

## 2022-11-17 NOTE — Progress Notes (Unsigned)
Elevated Cholesterol: Using medications without problems: yes, max dose tolerated '10mg'$  a day.  Insomnia with '20mg'$  dose.   Muscle aches: no Diet compliance: d/w pt.  Exercise: d/w pt.    No CP.  Not SOB.   D/w pt about checking his urine. Possibly saw crystals in his urine, on the toilet.  No blood in urine.  No dysuria.    Flu vaccine - 2023 PNA up to date Tdap 2014 shingrix d/w pt. covid vaccine 2021, d/w pt.  Colonoscopy 2022 Prostate cancer screening and PSA options (with potential risks and benefits of testing vs not testing) were discussed along with recent recs/guidelines.  He declined testing PSA at this point. AAA screen prev done.   Advance directive-wife designated if patient were incapacitated. Lung cancer screening program d/w pt. Not yet due for f/u screening as of 11/2022  Hyperglycemia.  H/o victoza use.  No ADE on med but can't get coverage.  It helped his appetite.  I'll check with pharmacy about coverage here, re: other options.   H/o melanoma. Still seeing dermatology.    PMH and SH reviewed  Meds, vitals, and allergies reviewed.   ROS: Per HPI unless specifically indicated in ROS section   GEN: nad, alert and oriented HEENT: ncat NECK: supple w/o LA CV: rrr. PULM: ctab, no inc wob ABD: soft, +bs EXT: no edema SKIN: no acute rash  30 minutes were devoted to patient care in this encounter (this includes time spent reviewing the patient's file/history, interviewing and examining the patient, counseling/reviewing plan with patient).

## 2022-11-17 NOTE — Assessment & Plan Note (Addendum)
He could try cutting back pravastatin and see if the aches get better.  Try alternating '10mg'$  vs '20mg'$ .  If needed, cut back to '10mg'$ .  I asked him to update me as needed.

## 2022-11-17 NOTE — Patient Instructions (Signed)
I'll check with pharmacy about options for victoza.   Go to the lab on the way out.   If you have mychart we'll likely use that to update you.    Take care.  Glad to see you.

## 2022-11-17 NOTE — Assessment & Plan Note (Signed)
Flu vaccine - 2023 PNA up to date Tdap 2014 shingrix d/w pt. covid vaccine 2021, d/w pt.  Colonoscopy 2022 Prostate cancer screening and PSA options (with potential risks and benefits of testing vs not testing) were discussed along with recent recs/guidelines.  He declined testing PSA at this point. AAA screen prev done.   Advance directive-wife designated if patient were incapacitated. Lung cancer screening program d/w pt. Not yet due for f/u screening as of 11/2022

## 2022-11-18 ENCOUNTER — Telehealth: Payer: Self-pay | Admitting: Family Medicine

## 2022-11-18 DIAGNOSIS — R8290 Unspecified abnormal findings in urine: Secondary | ICD-10-CM | POA: Insufficient documentation

## 2022-11-18 NOTE — Telephone Encounter (Signed)
H/o victoza use.  No ADE on med but can't get coverage.  It helped his appetite.  Do you have any ideas here about coverage versus substitution?  Thanks.

## 2022-11-18 NOTE — Assessment & Plan Note (Signed)
H/o victoza use.  No ADE on med but can't get coverage.  It helped his appetite.  I'll check with pharmacy about coverage here, re: other options.

## 2022-11-18 NOTE — Assessment & Plan Note (Signed)
No symptoms other than having seen residual crystals in the toilet.  See notes on labs.

## 2022-11-18 NOTE — Assessment & Plan Note (Signed)
With routine dermatology follow-up.

## 2022-11-18 NOTE — Assessment & Plan Note (Signed)
Advance directive- wife designated if patient were incapacitated.  

## 2022-11-19 MED ORDER — OZEMPIC (0.25 OR 0.5 MG/DOSE) 2 MG/3ML ~~LOC~~ SOPN: 0.2500 mg | PEN_INJECTOR | SUBCUTANEOUS | 12 refills | Status: DC

## 2022-11-19 NOTE — Telephone Encounter (Signed)
Sent. Thanks.   

## 2022-11-19 NOTE — Telephone Encounter (Signed)
Reviewed [employer] [place] 2024 formulary, Victoza is not listed but Ozempic and Trulicity are both covered at Tier 3 but may require PA. I think all [employer] plans have a deductible as well so until he meets that any GLP-1 RA will be >/= to the deductible amount.  Spoke with patient, he agrees to switch to Ozempic 0.25 mg weekly. He was able to afford Victoza all of 2023 so he thinks he will not have a problem with Ozempic.  Preferred pharmacy: [place], [place] - [address] [place] [address] Phone: [phone] Fax: [phone]

## 2022-11-19 NOTE — Addendum Note (Signed)
Addended by: Tonia Ghent on: 11/19/2022 01:35 PM   Modules accepted: Orders

## 2022-11-20 MED ORDER — OZEMPIC (0.25 OR 0.5 MG/DOSE) 2 MG/3ML ~~LOC~~ SOPN: 0.2500 mg | PEN_INJECTOR | SUBCUTANEOUS | 12 refills | Status: DC

## 2022-11-20 NOTE — Addendum Note (Signed)
Addended by: Sherrilee Gilles B on: 11/20/2022 10:19 AM   Modules accepted: Orders

## 2022-11-20 NOTE — Telephone Encounter (Signed)
Pt called in stated  RX needs to be sent to [place], [place] -  not [facility] because it not in his network # Please advise [phone]

## 2022-11-20 NOTE — Telephone Encounter (Signed)
Rx has been resent to [facility] as requested

## 2022-11-24 ENCOUNTER — Other Ambulatory Visit (HOSPITAL_COMMUNITY): Payer: Self-pay

## 2022-11-30 ENCOUNTER — Other Ambulatory Visit (HOSPITAL_COMMUNITY): Payer: Self-pay

## 2022-12-16 ENCOUNTER — Other Ambulatory Visit: Payer: Self-pay | Admitting: Acute Care

## 2022-12-16 ENCOUNTER — Encounter: Payer: Self-pay | Admitting: Family Medicine

## 2022-12-16 DIAGNOSIS — Z87891 Personal history of nicotine dependence: Secondary | ICD-10-CM

## 2022-12-21 IMAGING — CT CT CHEST LUNG CANCER SCREENING LOW DOSE W/O CM
1 series · 10 of 10 positions shown, 13 images · non-contrast
Comparison: Chest CT dated December 11, 2020

CLINICAL DATA: Former smoker with 40 pack-year history



[ct lung segmentation data · axial · 0.87mm/px · z∈[-219,-219]mm · 10 of 383 frames shown]
[frame 1/383  mediastinal]
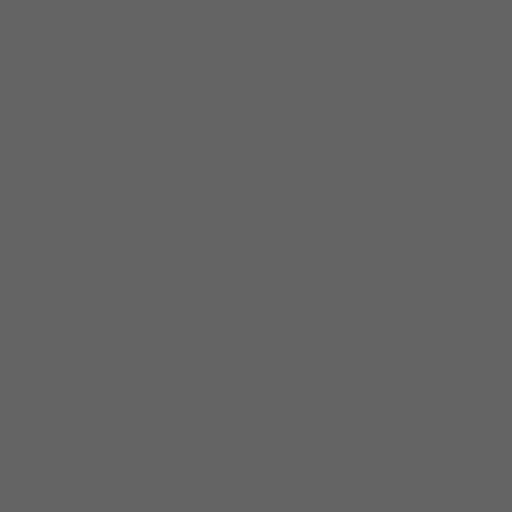
[frame 1/383  lung]
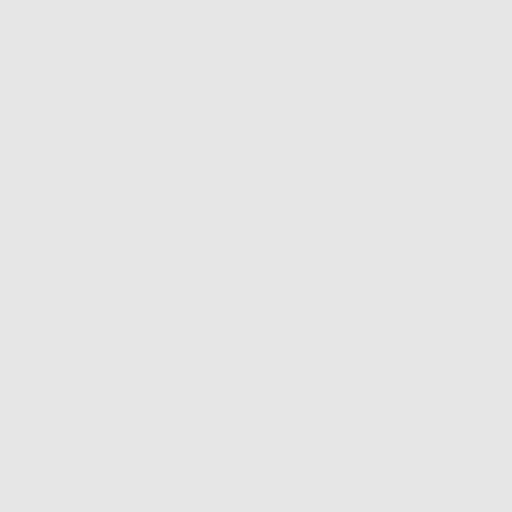
[frame 43/383  lung]
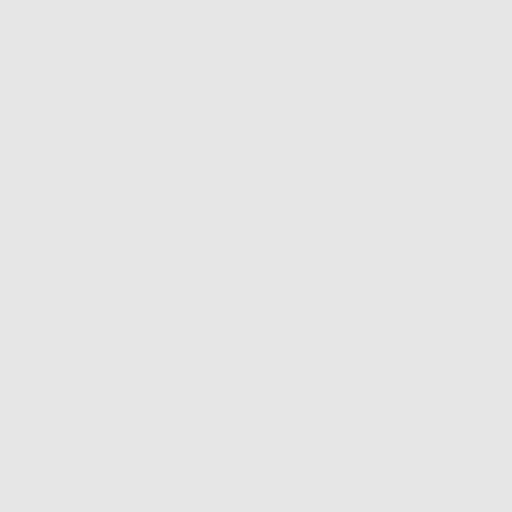
[frame 85/383  lung]
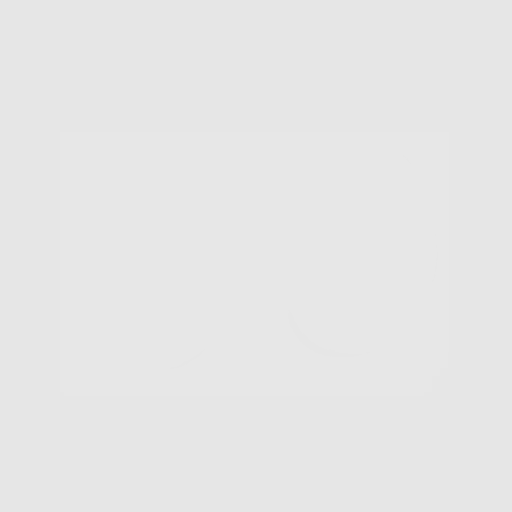
[frame 128/383  lung]
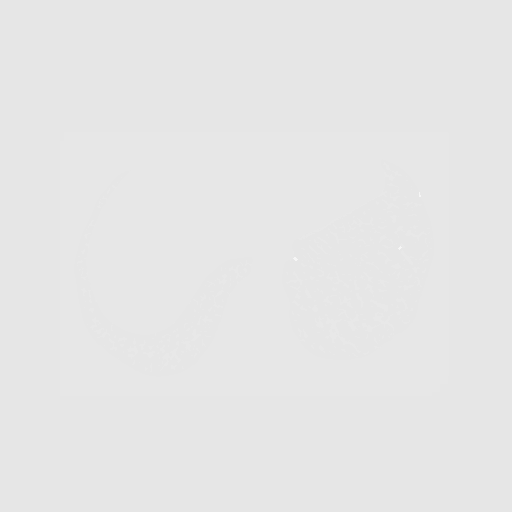
[frame 170/383  mediastinal]
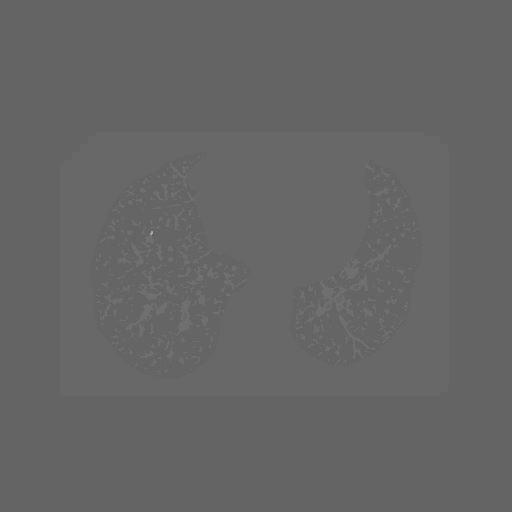
[frame 170/383  lung]
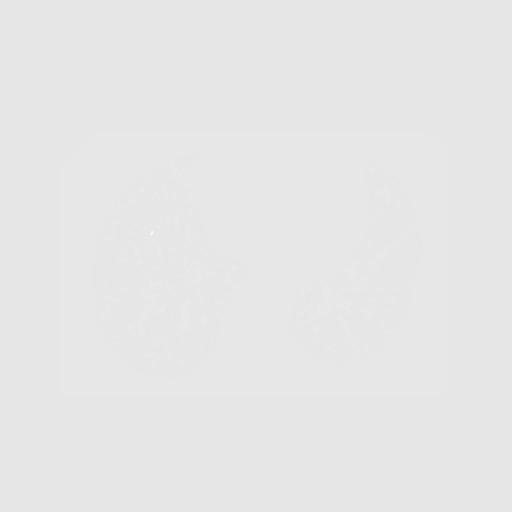
[frame 213/383  lung]
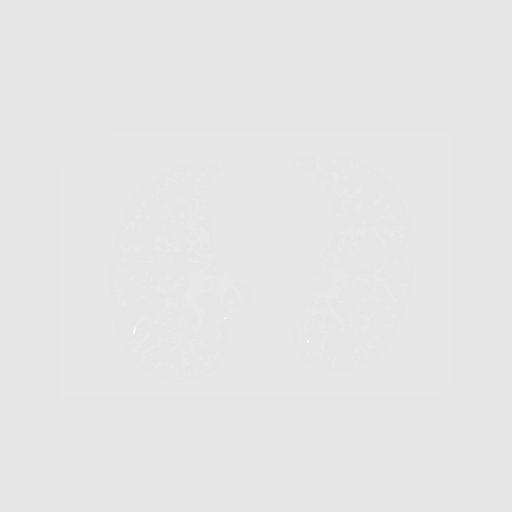
[frame 255/383  lung]
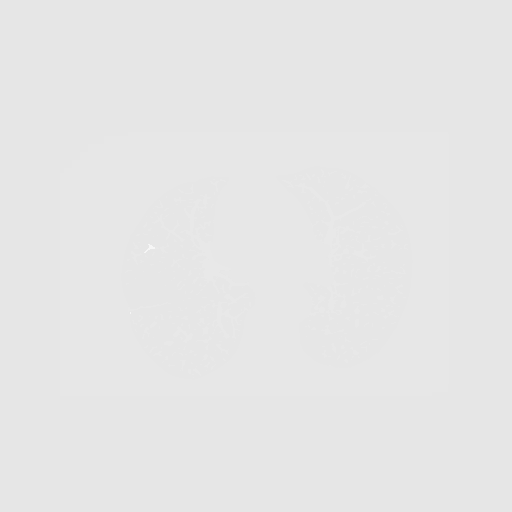
[frame 298/383  lung]
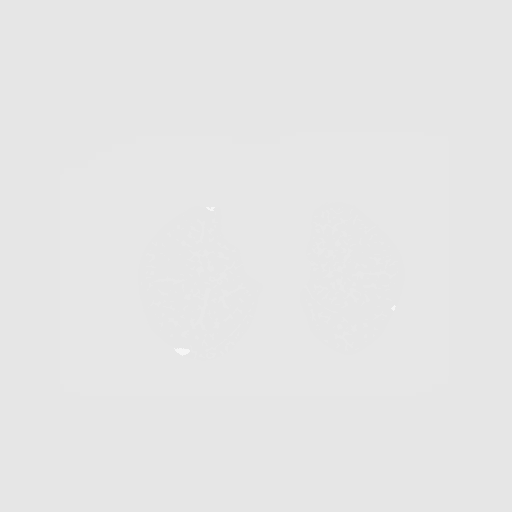
[frame 340/383  mediastinal]
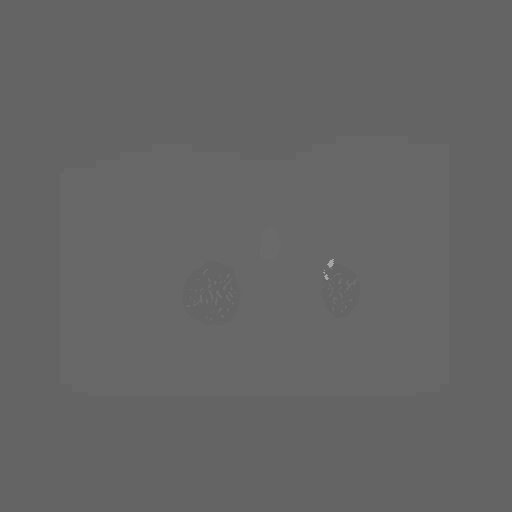
[frame 340/383  lung]
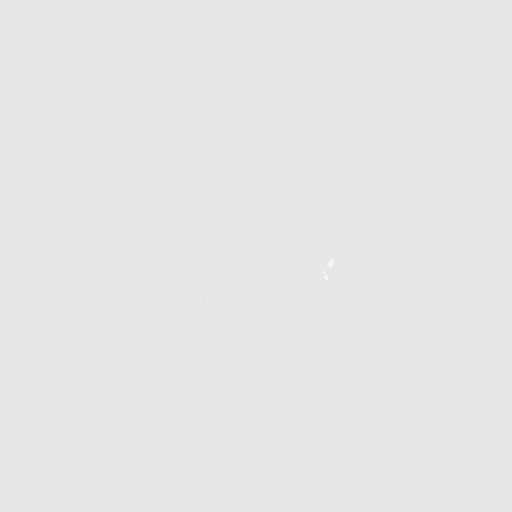
[frame 383/383  lung]
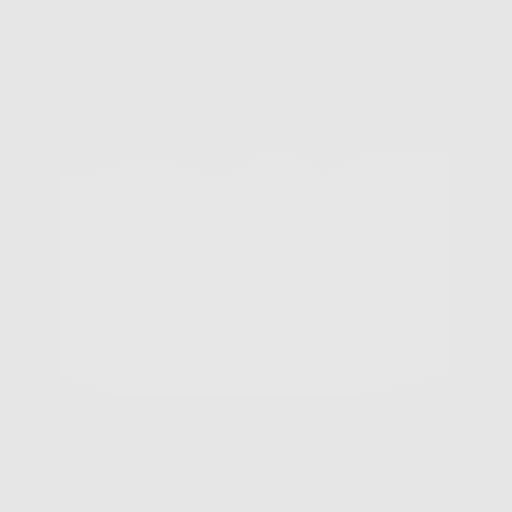

[10 of 10 positions shown; findings below may reference images not displayed]

FINDINGS: Cardiovascular: Normal heart size. No pericardial effusion.
Three-vessel coronary artery calcifications. Atherosclerotic disease
of the thoracic aorta.

Mediastinum/Nodes: Esophagus and thyroid are unremarkable. No
pathologically enlarged lymph nodes seen in the chest.

Lungs/Pleura: Central airways are patent. Mild centrilobular
emphysema. No consolidation, pleural effusion or pneumothorax.
Stable solid and calcified pulmonary nodules. Reference solid nodule
of the right upper lobe measuring 2.8 mm in mean diameter on image
147.

Upper Abdomen: No acute abnormality.

Musculoskeletal: No chest wall mass or suspicious bone lesions
identified.
IMPRESSION: 1. Lung-RADS 2, benign appearance or behavior. Continue annual
screening with low-dose chest CT without contrast in 12 months.
2. Coronary artery calcifications, aortic Atherosclerosis
(MYDSZ-5G8.8) and Emphysema ([id number].2).

## 2023-02-15 ENCOUNTER — Ambulatory Visit
Admission: RE | Admit: 2023-02-15 | Discharge: 2023-02-15 | Disposition: A | Discharge status: Home / Self Care | Payer: Medicare Other | Source: Ambulatory Visit | Attending: Acute Care | Admitting: Acute Care

## 2023-02-15 ENCOUNTER — Inpatient Hospital Stay: Admission: RE | Admit: 2023-02-15 | Payer: Medicare Other | Source: Ambulatory Visit

## 2023-02-15 DIAGNOSIS — J8410 Pulmonary fibrosis, unspecified: Secondary | ICD-10-CM | POA: Diagnosis not present

## 2023-02-15 DIAGNOSIS — J432 Centrilobular emphysema: Secondary | ICD-10-CM | POA: Diagnosis not present

## 2023-02-15 DIAGNOSIS — Z87891 Personal history of nicotine dependence: Secondary | ICD-10-CM

## 2023-02-15 DIAGNOSIS — I2510 Atherosclerotic heart disease of native coronary artery without angina pectoris: Secondary | ICD-10-CM | POA: Diagnosis not present

## 2023-02-17 ENCOUNTER — Other Ambulatory Visit: Payer: Self-pay

## 2023-02-17 DIAGNOSIS — Z87891 Personal history of nicotine dependence: Secondary | ICD-10-CM

## 2023-04-07 DIAGNOSIS — Z85820 Personal history of malignant melanoma of skin: Secondary | ICD-10-CM | POA: Diagnosis not present

## 2023-04-07 DIAGNOSIS — Z85828 Personal history of other malignant neoplasm of skin: Secondary | ICD-10-CM | POA: Diagnosis not present

## 2023-04-07 DIAGNOSIS — C44719 Basal cell carcinoma of skin of left lower limb, including hip: Secondary | ICD-10-CM | POA: Diagnosis not present

## 2023-04-07 DIAGNOSIS — D2272 Melanocytic nevi of left lower limb, including hip: Secondary | ICD-10-CM | POA: Diagnosis not present

## 2023-04-07 DIAGNOSIS — D2262 Melanocytic nevi of left upper limb, including shoulder: Secondary | ICD-10-CM | POA: Diagnosis not present

## 2023-04-07 DIAGNOSIS — D2261 Melanocytic nevi of right upper limb, including shoulder: Secondary | ICD-10-CM | POA: Diagnosis not present

## 2023-04-07 DIAGNOSIS — D485 Neoplasm of uncertain behavior of skin: Secondary | ICD-10-CM | POA: Diagnosis not present

## 2023-04-07 DIAGNOSIS — L570 Actinic keratosis: Secondary | ICD-10-CM | POA: Diagnosis not present

## 2023-04-07 DIAGNOSIS — C44619 Basal cell carcinoma of skin of left upper limb, including shoulder: Secondary | ICD-10-CM | POA: Diagnosis not present

## 2023-04-07 DIAGNOSIS — D225 Melanocytic nevi of trunk: Secondary | ICD-10-CM | POA: Diagnosis not present

## 2023-04-20 ENCOUNTER — Telehealth: Payer: Medicare Other | Admitting: Physician Assistant

## 2023-04-20 DIAGNOSIS — C44719 Basal cell carcinoma of skin of left lower limb, including hip: Secondary | ICD-10-CM | POA: Diagnosis not present

## 2023-04-20 DIAGNOSIS — J019 Acute sinusitis, unspecified: Secondary | ICD-10-CM | POA: Diagnosis not present

## 2023-04-20 DIAGNOSIS — C44619 Basal cell carcinoma of skin of left upper limb, including shoulder: Secondary | ICD-10-CM | POA: Diagnosis not present

## 2023-04-20 DIAGNOSIS — B9689 Other specified bacterial agents as the cause of diseases classified elsewhere: Secondary | ICD-10-CM

## 2023-04-20 MED ORDER — AMOXICILLIN-POT CLAVULANATE 875-125 MG PO TABS: 1.0000 | ORAL_TABLET | Freq: Two times a day (BID) | ORAL | 0 refills | Status: DC

## 2023-04-20 NOTE — Progress Notes (Signed)

## 2023-04-20 NOTE — Progress Notes (Signed)
I have spent 5 minutes in review of e-visit questionnaire, review and updating patient chart, medical decision making and response to patient.   Norvin Cody Alfhild Partch, PA-C    

## 2023-05-27 DIAGNOSIS — H524 Presbyopia: Secondary | ICD-10-CM | POA: Diagnosis not present

## 2023-05-27 DIAGNOSIS — H52223 Regular astigmatism, bilateral: Secondary | ICD-10-CM | POA: Diagnosis not present

## 2023-05-27 DIAGNOSIS — H5203 Hypermetropia, bilateral: Secondary | ICD-10-CM | POA: Diagnosis not present

## 2023-07-08 ENCOUNTER — Encounter: Payer: Self-pay | Admitting: Family Medicine

## 2023-09-16 ENCOUNTER — Telehealth: Payer: Self-pay | Admitting: Family Medicine

## 2023-09-16 NOTE — Telephone Encounter (Signed)
Patient called in and stated that Victoza is now cheaper. He was wanting to go back on the generic medication of Victoza. Thank you!

## 2023-09-20 NOTE — Telephone Encounter (Signed)
Pt called for update on medication? Call back # [phone]

## 2023-09-20 NOTE — Telephone Encounter (Signed)
Called and notified patient Dr. Para March is out of the office until 09/23/23, he is understanding and will await a response from Dr. Para March

## 2023-09-22 MED ORDER — LIRAGLUTIDE 18 MG/3ML ~~LOC~~ SOPN: PEN_INJECTOR | SUBCUTANEOUS | 1 refills | Status: DC

## 2023-09-22 NOTE — Telephone Encounter (Signed)
Sent.  Please update patient.  Thanks.

## 2023-09-22 NOTE — Addendum Note (Signed)
Addended by: Eual Fines on: 09/22/2023 03:17 PM   Modules accepted: Orders

## 2023-09-22 NOTE — Addendum Note (Signed)
Addended by: Joaquim Nam on: 09/22/2023 01:15 PM   Modules accepted: Orders

## 2023-09-22 NOTE — Telephone Encounter (Signed)
Called patient updated and provided pharmacy name called to. Will reach out if any questions.

## 2023-09-22 NOTE — Telephone Encounter (Addendum)
Rx sent to [facility]. Spoke to pt. He asked if it was for the generic. I told him I did not think there was a generic but he stated thre has been one since June as his wife has gotten it. I advised him that the rx was sent in as Victoza (liraglutide). It is not DAW so he should be able to request generic. He will let us know if he has any issues.

## 2023-09-22 NOTE — Telephone Encounter (Signed)
Patient called in and stated that he needs the prescription to go over to [id number] - [place], [place] - [address] He stated that its $600 at [facility] and $150 at [facility]. Thank you!

## 2023-10-27 DIAGNOSIS — D2261 Melanocytic nevi of right upper limb, including shoulder: Secondary | ICD-10-CM | POA: Diagnosis not present

## 2023-10-27 DIAGNOSIS — D2272 Melanocytic nevi of left lower limb, including hip: Secondary | ICD-10-CM | POA: Diagnosis not present

## 2023-10-27 DIAGNOSIS — Z85820 Personal history of malignant melanoma of skin: Secondary | ICD-10-CM | POA: Diagnosis not present

## 2023-10-27 DIAGNOSIS — D2262 Melanocytic nevi of left upper limb, including shoulder: Secondary | ICD-10-CM | POA: Diagnosis not present

## 2023-10-27 DIAGNOSIS — Z85828 Personal history of other malignant neoplasm of skin: Secondary | ICD-10-CM | POA: Diagnosis not present

## 2023-10-27 DIAGNOSIS — D485 Neoplasm of uncertain behavior of skin: Secondary | ICD-10-CM | POA: Diagnosis not present

## 2023-10-27 DIAGNOSIS — L565 Disseminated superficial actinic porokeratosis (DSAP): Secondary | ICD-10-CM | POA: Diagnosis not present

## 2023-10-27 DIAGNOSIS — L570 Actinic keratosis: Secondary | ICD-10-CM | POA: Diagnosis not present

## 2023-10-27 DIAGNOSIS — D225 Melanocytic nevi of trunk: Secondary | ICD-10-CM | POA: Diagnosis not present

## 2023-11-05 ENCOUNTER — Ambulatory Visit (INDEPENDENT_AMBULATORY_CARE_PROVIDER_SITE_OTHER): Payer: Medicare Other

## 2023-11-05 VITALS — Ht 71.0 in | Wt 275.0 lb

## 2023-11-05 DIAGNOSIS — Z0000 Encounter for general adult medical examination without abnormal findings: Secondary | ICD-10-CM

## 2023-11-05 NOTE — Progress Notes (Signed)
Subjective:   Jorge Benton is a 72 y.o. male who presents for Medicare Annual/Subsequent preventive examination.  Visit Complete: Virtual I connected with  Jorge Benton on 11/05/23 by a audio enabled telemedicine application and verified that I am speaking with the correct person using two identifiers.  Patient Location: Home  Provider Location: Office/Clinic  I discussed the limitations of evaluation and management by telemedicine. The patient expressed understanding and agreed to proceed.  Vital Signs: Because this visit was a virtual/telehealth visit, some criteria may be missing or patient reported. Any vitals not documented were not able to be obtained and vitals that have been documented are patient reported.  Patient Medicare AWV questionnaire was completed by the patient on 11/01/23; I have confirmed that all information answered by patient is correct and no changes since this date.  Cardiac Risk Factors include: advanced age (>24men, >51 women);dyslipidemia;male gender;obesity (BMI >30kg/m2)    Objective:    Today's Vitals   11/05/23 0925  Weight: 275 lb (124.7 kg)  Height: 5\' 11"  (1.803 m)   Body mass index is 38.35 kg/m.     11/05/2023    9:32 AM 11/03/2022   11:52 AM 10/24/2021    8:21 AM 10/06/2019    9:19 AM 09/26/2018   10:25 AM 09/23/2017   11:10 AM  Advanced Directives  Does Patient Have a Medical Advance Directive? Yes Yes Yes No No Yes  Type of Estate agent of [place];Living will Healthcare Power of [place];Living will Healthcare Power of [place];Living will   Healthcare Power of [place];Living will  Does patient want to make changes to medical advance directive?   Yes (MAU/Ambulatory/Procedural Areas - Information given)     Copy of Healthcare Power of Attorney in Chart? No - copy requested No - copy requested    No - copy requested  Would patient like information on creating a medical advance directive?    Yes  (MAU/Ambulatory/Procedural Areas - Information given) Yes (MAU/Ambulatory/Procedural Areas - Information given)     Current Medications (verified) Outpatient Encounter Medications as of 11/05/2023  Medication Sig   cetirizine (ZYRTEC) 10 MG tablet Take 10 mg by mouth daily as needed.   cholecalciferol (VITAMIN D3) 25 MCG (1000 UNIT) tablet Take 1,000 Units by mouth daily.   Cyanocobalamin (VITAMIN B 12 PO) Take by mouth.   Glucosamine 500 MG CAPS Take by mouth daily.   Green Tea, Camellia sinensis, (GREEN TEA EXTRACT PO) Take 2 capsules by mouth daily.   ibuprofen (ADVIL,MOTRIN) 200 MG tablet Take 200 mg by mouth as needed.   Insulin Pen Needle (B-D UF III MINI PEN NEEDLES) 31G X 5 MM MISC USE WITH VICTOZA   liraglutide (VICTOZA) 18 MG/3ML SOPN Start 0.6mg  SQ once a day for 7 days, then increase to 1.2mg  once a day if tolerated.   Multiple Vitamins-Minerals (CENTRUM SILVER PO) Take by mouth daily.   pravastatin (PRAVACHOL) 10 MG tablet Take 1 tablet (10 mg total) by mouth daily.   Pyridoxine HCl (B-6 PO) Take 1 capsule by mouth daily.   No facility-administered encounter medications on file as of 11/05/2023.    Allergies (verified) Tramadol   History: Past Medical History:  Diagnosis Date   Allergy    Blood transfusion without reported diagnosis 1967   with appendectomy    CAD (coronary artery disease)    Environmental allergies    Hyperlipidemia    Melanoma (HCC) 06/2010   [facility]   Sinusitis 1968   severe  in Hospital   Past Surgical History:  Procedure Laterality Date   APPENDECTOMY  1965   COLONOSCOPY     HERNIA REPAIR  1990   MOHS SURGERY     06/2010- melanoma L thigh   POLYPECTOMY     SKIN SURGERY Left 01/2021   upper left chest   Family History  Problem Relation Age of Onset   Uterine cancer Mother        hysterectomy 59, chemo and radiation   Hypothyroidism Mother    Hypertension Mother    Heart disease Father        MI after surgery   Stroke  Maternal Grandfather    Colon cancer Neg Hx    Prostate cancer Neg Hx    Pancreatic cancer Neg Hx    Stomach cancer Neg Hx    Colon polyps Neg Hx    Esophageal cancer Neg Hx    Rectal cancer Neg Hx    Social History   Socioeconomic History   Marital status: Married    Spouse name: Not on file   Number of children: 3   Years of education: Not on file   Highest education level: Not on file  Occupational History   Occupation: Scientist, product/process development  Tobacco Use   Smoking status: Former    Current packs/day: 0.00    Average packs/day: 1 pack/day for 45.0 years (45.0 ttl pk-yrs)    Types: Cigarettes    Start date: 73    Quit date: 2012    Years since quitting: 12.9   Smokeless tobacco: Never  Vaping Use   Vaping status: Never Used  Substance and Sexual Activity   Alcohol use: Yes    Alcohol/week: 1.0 standard drink of alcohol    Types: 1 Standard drinks or equivalent per week    Comment: cocktail   Drug use: No   Sexual activity: Not on file  Other Topics Concern   Not on file  Social History Narrative   Engineer, manufacturing   Married 1975   3 kids   Social Drivers of Health   Financial Resource Strain: Low Risk  (11/01/2023)   Overall Financial Resource Strain (CARDIA)    Difficulty of Paying Living Expenses: Not hard at all  Food Insecurity: No Food Insecurity (11/01/2023)   Hunger Vital Sign    Worried About Running Out of Food in the Last Year: Never true    Ran Out of Food in the Last Year: Never true  Transportation Needs: No Transportation Needs (11/01/2023)   PRAPARE - Administrator, Civil Service (Medical): No    Lack of Transportation (Non-Medical): No  Physical Activity: Insufficiently Active (11/01/2023)   Exercise Vital Sign    Days of Exercise per Week: 2 days    Minutes of Exercise per Session: 60 min  Stress: No Stress Concern Present (11/01/2023)   [employer] of Occupational Health - Occupational Stress Questionnaire    Feeling of  Stress : Not at all  Social Connections: Unknown (11/01/2023)   Social Connection and Isolation Panel [NHANES]    Frequency of Communication with Friends and Family: Not on file    Frequency of Social Gatherings with Friends and Family: Three times a week    Attends Religious Services: Not on file    Active Member of Clubs or Organizations: Yes    Attends Club or Organization Meetings: More than 4 times per year    Marital Status: Married    Tobacco Counseling Counseling given:  Not Answered   Clinical Intake:  Pre-visit preparation completed: No  Pain : No/denies pain    BMI - recorded: 38.35 Nutritional Status: BMI > 30  Obese Nutritional Risks: None Diabetes: No  How often do you need to have someone help you when you read instructions, pamphlets, or other written materials from your doctor or pharmacy?: 1 - Never  Interpreter Needed?: No  Comments: lives with wife Information entered by :: B.Airam Runions,LPN   Activities of Daily Living    11/01/2023    8:55 AM  In your present state of health, do you have any difficulty performing the following activities:  Hearing? 0  Vision? 0  Difficulty concentrating or making decisions? 0  Walking or climbing stairs? 0  Dressing or bathing? 0  Doing errands, shopping? 0  Preparing Food and eating ? N  Using the Toilet? N  In the past six months, have you accidently leaked urine? N  Do you have problems with loss of bowel control? N  Managing your Medications? N  Managing your Finances? N  Housekeeping or managing your Housekeeping? N    Patient Care Team: Joaquim Nam, MD as PCP - General (Family Medicine) Buck Mam, DDS as Referring Physician (Dentistry) Kingsley, [place], OD (Optometry) Dasher, Cliffton Asters, MD (Dermatology)  Indicate any recent Medical Services you may have received from other than [facility] providers in the past year (date may be approximate).     Assessment:   This is a routine wellness  examination for Jorge Benton.  Hearing/Vision screen Hearing Screening - Comments:: Pt says he is hearing good Vision Screening - Comments:: Pt says he wears glasses and vision is fine [place]   Goals Addressed             This Visit's Progress    COMPLETED: Increase physical activity   On track    Starting 09/26/2018, I will continue to walk for 30 minutes 2-3 times per day and to ride stationary bike for 30 minutes twice weekly.      Patient Stated   On track    11/05/2023, I will continue to exercise and work on losing weight     Patient Stated   On track    Would like to drink more water and increase exercise to 3 days a week. Will lose another 10lbs. Will keep water goals     COMPLETED: Patient Stated   On track    Lose more weight        Depression Screen    11/05/2023    9:29 AM 11/03/2022   11:51 AM 10/24/2021    8:24 AM 10/21/2020   10:08 AM 10/06/2019    9:21 AM 09/26/2018    3:49 PM 09/23/2017   11:10 AM  PHQ 2/9 Scores  PHQ - 2 Score 0 0 0 0 0 0 0  PHQ- 9 Score     0 0 0    Fall Risk    11/01/2023    8:55 AM 11/03/2022   11:53 AM 10/24/2021    8:23 AM 10/21/2020   10:08 AM 10/11/2019   11:33 AM  Fall Risk   Falls in the past year? 0 0 0 0 0  Comment     Emmi Telephone Survey: data to providers prior to load  Number falls in past yr: 0 0 0 0   Injury with Fall? 0 0 0 0   Risk for fall due to : No Fall Risks Impaired vision No Fall  Risks    Follow up Education provided;Falls prevention discussed Falls prevention discussed Falls prevention discussed Falls evaluation completed     MEDICARE RISK AT HOME: Medicare Risk at Home Any stairs in or around the home?: No If so, are there any without handrails?: No Home free of loose throw rugs in walkways, pet beds, electrical cords, etc?: Yes Adequate lighting in your home to reduce risk of falls?: Yes Life alert?: No Use of a cane, walker or w/c?: No Grab bars in the bathroom?: No Shower  chair or bench in shower?: No Elevated toilet seat or a handicapped toilet?: Yes  TIMED UP AND GO:  Was the test performed?  No    Cognitive Function:    10/06/2019    9:23 AM 09/26/2018    3:49 PM 09/23/2017   11:10 AM  MMSE - Mini Mental State Exam  Orientation to time 5 5 5   Orientation to Place 5 5 5   Registration 3 3 3   Attention/ Calculation 5 0 0  Recall 3 3 3   Language- name 2 objects  0 0  Language- repeat 1 1 1   Language- follow 3 step command  3 3  Language- read & follow direction  0 0  Write a sentence  0 0  Copy design  0 0  Total score  20 20        11/05/2023    9:39 AM 11/03/2022   11:54 AM  6CIT Screen  What Year? 0 points 0 points  What month? 0 points 0 points  What time? 0 points 0 points  Count back from 20 0 points 0 points  Months in reverse 0 points 0 points  Repeat phrase 0 points 0 points  Total Score 0 points 0 points    Immunizations Immunization History  Administered Date(s) Administered   Fluad Quad(high Dose 65+) 10/16/2019, 10/21/2020, 11/14/2021, 11/17/2022   Influenza,inj,Quad PF,6+ Mos 09/12/2013, 09/13/2014, 09/16/2015, 09/22/2016, 10/01/2017, 09/26/2018   PFIZER(Purple Top)SARS-COV-2 Vaccination 12/05/2019, 12/25/2019, 08/16/2020   Pneumococcal Conjugate-13 09/22/2016   Pneumococcal Polysaccharide-23 10/01/2017   Td 07/24/2003   Tdap 09/12/2013   Zoster, Live 05/02/2012    TDAP status: Up to date  Flu Vaccine status: Due, Education has been provided regarding the importance of this vaccine. Advised may receive this vaccine at local pharmacy or Health Dept. Aware to provide a copy of the vaccination record if obtained from local pharmacy or Health Dept. Verbalized acceptance and understanding.  Pneumococcal vaccine status: Up to date  Covid-19 vaccine status: Completed vaccines  Qualifies for Shingles Vaccine? Yes   Zostavax completed No   Shingrix Completed?: No.    Education has been provided regarding the  importance of this vaccine. Patient has been advised to call insurance company to determine out of pocket expense if they have not yet received this vaccine. Advised may also receive vaccine at local pharmacy or Health Dept. Verbalized acceptance and understanding.  Screening Tests Health Maintenance  Topic Date Due   Zoster Vaccines- Shingrix (1 of 2) 02/03/2024 (Originally 09/11/1970)   INFLUENZA VACCINE  02/14/2024 (Originally 06/17/2023)   DTaP/Tdap/Td (3 - Td or Tdap) 11/04/2024 (Originally 09/13/2023)   Lung Cancer Screening  02/15/2024   Medicare Annual Wellness (AWV)  11/04/2024   Colonoscopy  01/20/2026   Pneumonia Vaccine 97+ Years old  Completed   Hepatitis C Screening  Completed   HPV VACCINES  Aged Out   COVID-19 Vaccine  Discontinued    Health Maintenance  There are no preventive care reminders  to display for this patient.   Colorectal cancer screening: Type of screening: Colonoscopy. Completed 01/20/2021. Repeat every 5 years  Lung Cancer Screening: (Low Dose CT Chest recommended if Age 3-80 years, 20 pack-year currently smoking OR have quit w/in 15years.) does not qualify.   Lung Cancer Screening Referral: no  Additional Screening:  Hepatitis C Screening: does not qualify; Completed 09/15/2016  Vision Screening: Recommended annual ophthalmology exams for early detection of glaucoma and other disorders of the eye. Is the patient up to date with their annual eye exam?  Yes  Who is the provider or what is the name of the office in which the patient attends annual eye exams? Dr Daisey Must If pt is not established with a provider, would they like to be referred to a provider to establish care? No .   Dental Screening: Recommended annual dental exams for proper oral hygiene  Diabetic Foot Exam: n/a  Community Resource Referral / Chronic Care Management: CRR required this visit?  No   CCM required this visit?  No     Plan:     I have personally reviewed and noted  the following in the patient's chart:   Medical and social history Use of alcohol, tobacco or illicit drugs  Current medications and supplements including opioid prescriptions. Patient is not currently taking opioid prescriptions. Functional ability and status Nutritional status Physical activity Advanced directives List of other physicians Hospitalizations, surgeries, and ER visits in previous 12 months Vitals Screenings to include cognitive, depression, and falls Referrals and appointments  In addition, I have reviewed and discussed with patient certain preventive protocols, quality metrics, and best practice recommendations. A written personalized care plan for preventive services as well as general preventive health recommendations were provided to patient.    Sue Lush, LPN   09/60/4540   After Visit Summary: (MyChart) Due to this being a telephonic visit, the after visit summary with patients personalized plan was offered to patient via MyChart   Nurse Notes: The patient states he is doing well and has no concerns or questions at this time.

## 2023-11-05 NOTE — Patient Instructions (Signed)
Jorge Benton , Thank you for taking time to come for your Medicare Wellness Visit. I appreciate your ongoing commitment to your health goals. Please review the following plan we discussed and let me know if I can assist you in the future.   Referrals/Orders/Follow-Ups/Clinician Recommendations: none  This is a list of the screening recommended for you and due dates:  Health Maintenance  Topic Date Due   Zoster (Shingles) Vaccine (1 of 2) 09[birth year]11/1970   Flu Shot  06/17/2023   DTaP/Tdap/Td vaccine (3 - Td or Tdap) 09/13/2023   Screening for Lung Cancer  02/15/2024   Medicare Annual Wellness Visit  11/04/2024   Colon Cancer Screening  01/20/2026   Pneumonia Vaccine  Completed   Hepatitis C Screening  Completed   HPV Vaccine  Aged Out   COVID-19 Vaccine  Discontinued    Advanced directives: (Copy Requested) Please bring a copy of your health care power of attorney and living will to the office to be added to your chart at your convenience.  Next Medicare Annual Wellness Visit scheduled for next year: Yes 11/07/2024 @ 9:30am televisit

## 2023-11-07 ENCOUNTER — Other Ambulatory Visit: Payer: Self-pay | Admitting: Family Medicine

## 2023-11-07 DIAGNOSIS — R739 Hyperglycemia, unspecified: Secondary | ICD-10-CM

## 2023-11-07 DIAGNOSIS — E785 Hyperlipidemia, unspecified: Secondary | ICD-10-CM

## 2023-11-12 ENCOUNTER — Other Ambulatory Visit (INDEPENDENT_AMBULATORY_CARE_PROVIDER_SITE_OTHER): Payer: Medicare Other

## 2023-11-12 DIAGNOSIS — E785 Hyperlipidemia, unspecified: Secondary | ICD-10-CM

## 2023-11-12 DIAGNOSIS — R739 Hyperglycemia, unspecified: Secondary | ICD-10-CM

## 2023-11-12 LAB — CBC WITH DIFFERENTIAL/PLATELET
Basophils Absolute: 0 10*3/uL (ref 0.0–0.1)
Basophils Relative: 0.6 % (ref 0.0–3.0)
Eosinophils Absolute: 0.1 10*3/uL (ref 0.0–0.7)
Eosinophils Relative: 2 % (ref 0.0–5.0)
HCT: 42 % (ref 39.0–52.0)
Hemoglobin: 14 g/dL (ref 13.0–17.0)
Lymphocytes Relative: 20.2 % (ref 12.0–46.0)
Lymphs Abs: 1.4 10*3/uL (ref 0.7–4.0)
MCHC: 33.3 g/dL (ref 30.0–36.0)
MCV: 87.5 fL (ref 78.0–100.0)
Monocytes Absolute: 0.8 10*3/uL (ref 0.1–1.0)
Monocytes Relative: 12.2 % — ABNORMAL HIGH (ref 3.0–12.0)
Neutro Abs: 4.4 10*3/uL (ref 1.4–7.7)
Neutrophils Relative %: 65 % (ref 43.0–77.0)
Platelets: 209 10*3/uL (ref 150.0–400.0)
RBC: 4.81 Mil/uL (ref 4.22–5.81)
RDW: 14.6 % (ref 11.5–15.5)
WBC: 6.7 10*3/uL (ref 4.0–10.5)

## 2023-11-12 LAB — COMPREHENSIVE METABOLIC PANEL
ALT: 16 U/L (ref 0–53)
AST: 18 U/L (ref 0–37)
Albumin: 4 g/dL (ref 3.5–5.2)
Alkaline Phosphatase: 90 U/L (ref 39–117)
BUN: 14 mg/dL (ref 6–23)
CO2: 29 meq/L (ref 19–32)
Calcium: 8.7 mg/dL (ref 8.4–10.5)
Chloride: 105 meq/L (ref 96–112)
Creatinine, Ser: 0.94 mg/dL (ref 0.40–1.50)
GFR: 81.18 mL/min (ref >60.00)
Glucose, Bld: 106 mg/dL — ABNORMAL HIGH (ref 70–99)
Potassium: 4.1 meq/L (ref 3.5–5.1)
Sodium: 141 meq/L (ref 135–145)
Total Bilirubin: 0.5 mg/dL (ref 0.2–1.2)
Total Protein: 6.3 g/dL (ref 6.0–8.3)

## 2023-11-12 LAB — LIPID PANEL
Cholesterol: 154 mg/dL (ref 0–200)
HDL: 31.2 mg/dL — ABNORMAL LOW (ref >39.00)
LDL Cholesterol: 87 mg/dL (ref 0–99)
NonHDL: 122.71
Total CHOL/HDL Ratio: 5
Triglycerides: 179 mg/dL — ABNORMAL HIGH (ref 0.0–149.0)
VLDL: 35.8 mg/dL (ref 0.0–40.0)

## 2023-11-12 LAB — TSH: TSH: 4.68 u[IU]/mL (ref 0.35–5.50)

## 2023-11-12 LAB — HEMOGLOBIN A1C: Hgb A1c MFr Bld: 5.8 % (ref 4.6–6.5)

## 2023-11-19 ENCOUNTER — Encounter: Payer: Self-pay | Admitting: Family Medicine

## 2023-11-19 ENCOUNTER — Ambulatory Visit (INDEPENDENT_AMBULATORY_CARE_PROVIDER_SITE_OTHER): Payer: Medicare Other | Admitting: Family Medicine

## 2023-11-19 VITALS — BP 132/82 | HR 84 | Temp 98.1°F | Ht 69.69 in | Wt 277.0 lb

## 2023-11-19 DIAGNOSIS — Z7189 Other specified counseling: Secondary | ICD-10-CM

## 2023-11-19 DIAGNOSIS — Z0000 Encounter for general adult medical examination without abnormal findings: Secondary | ICD-10-CM

## 2023-11-19 DIAGNOSIS — Z23 Encounter for immunization: Secondary | ICD-10-CM | POA: Diagnosis not present

## 2023-11-19 DIAGNOSIS — E669 Obesity, unspecified: Secondary | ICD-10-CM

## 2023-11-19 DIAGNOSIS — E785 Hyperlipidemia, unspecified: Secondary | ICD-10-CM | POA: Diagnosis not present

## 2023-11-19 DIAGNOSIS — Z85820 Personal history of malignant melanoma of skin: Secondary | ICD-10-CM

## 2023-11-19 MED ORDER — LIRAGLUTIDE 18 MG/3ML ~~LOC~~ SOPN: PEN_INJECTOR | SUBCUTANEOUS | Status: DC

## 2023-11-19 MED ORDER — LIRAGLUTIDE 18 MG/3ML ~~LOC~~ SOPN: 1.2000 mg | PEN_INJECTOR | Freq: Every day | SUBCUTANEOUS | 12 refills | Status: DC

## 2023-11-19 MED ORDER — PRAVASTATIN SODIUM 10 MG PO TABS: 10.0000 mg | ORAL_TABLET | Freq: Every day | ORAL | 3 refills | Status: DC

## 2023-11-19 NOTE — Progress Notes (Signed)
 Flu vaccine - 2025 PNA up to date Tdap 2014 shingrix d/w pt. covid vaccine prev done, d/w pt.  Colonoscopy 2022 Prostate cancer screening and PSA options (with potential risks and benefits of testing vs not testing) were discussed along with recent recs/guidelines.  He declined testing PSA at this point. AAA screen prev done.   Advance directive-wife designated if patient were incapacitated. Lung cancer screening program d/w pt. Not yet due for f/u screening as of 11/2023  Elevated Cholesterol/CAD.  Using medications without problems: yes Muscle aches: no Diet compliance: d/w pt.  Exercise: d/w pt.   He had insomnia with 20mg  pravastatin  but not with 10mg .    He is still seeing dermatology re: h/o melanoma.    He is taking liraglutide  1.2 mg daily.  No ADE on med.  No abd or GI sx.  He is down 12 lbs.   Meds, vitals, and allergies reviewed.   ROS: Per HPI unless specifically indicated in ROS section   GEN: nad, alert and oriented HEENT: ncat NECK: supple w/o LA CV: rrr. PULM: ctab, no inc wob ABD: soft, +bs EXT: no edema SKIN: no acute rash  31 minutes were devoted to patient care in this encounter (this includes time spent reviewing the patient's file/history, interviewing and examining the patient, counseling/reviewing plan with patient).

## 2023-11-19 NOTE — Patient Instructions (Addendum)
 Check with the pharmacy about a tetanus and shingles shot.   Flu shot today.  Take care.  Glad to see you.

## 2023-11-21 NOTE — Assessment & Plan Note (Signed)
 History of, with routine dermatology follow-up.

## 2023-11-21 NOTE — Assessment & Plan Note (Signed)
 Advance directive- wife designated if patient were incapacitated.

## 2023-11-21 NOTE — Assessment & Plan Note (Signed)
 He is taking liraglutide 1.2 mg daily.  No ADE on med.  No abd or GI sx.  He is down 12 lbs. would continue [place] low tide as is.  He has weight loss noted without adverse effect at this dose.  He can update me as needed.

## 2023-11-21 NOTE — Assessment & Plan Note (Signed)
 He had insomnia with 20mg  pravastatin but not with 10mg .   Continue pravastatin as is.  Continue work on diet and exercise.

## 2023-11-21 NOTE — Assessment & Plan Note (Signed)
 Flu vaccine - 2025 PNA up to date Tdap 2014 shingrix d/w pt. covid vaccine prev done, d/w pt.  Colonoscopy 2022 Prostate cancer screening and PSA options (with potential risks and benefits of testing vs not testing) were discussed along with recent recs/guidelines.  He declined testing PSA at this point. AAA screen prev done.   Advance directive-wife designated if patient were incapacitated. Lung cancer screening program d/w pt. Not yet due for f/u screening as of 11/2023

## 2023-11-22 ENCOUNTER — Telehealth: Payer: Self-pay

## 2023-11-22 ENCOUNTER — Other Ambulatory Visit (HOSPITAL_COMMUNITY): Payer: Self-pay

## 2023-11-22 NOTE — Telephone Encounter (Signed)
 Pharmacy Patient Advocate Encounter   Received notification from CoverMyMeds that prior authorization for Liraglutide  18MG /3ML pen-injectors is required/requested.   Insurance verification completed.   The patient is insured through [employer]  .   Per test claim: PA required; PA submitted to above mentioned insurance via CoverMyMeds Key/confirmation #/EOC [id number] Status is pending

## 2023-11-24 NOTE — Telephone Encounter (Signed)
 Denied. Liraglutide  is not being prescribed for an FDA labeled or medically accepted use. A medically accepted use is approved by the FDA or supported by the [facility] Formulary Service Drug Information and the DRUGDEX Information System. In this case, Liraglutide  is not being prescribed in accordance with an FDA labeled use or use accepted by the Medicare approved drug compendia.

## 2023-11-24 NOTE — Telephone Encounter (Signed)
 Patient's pharmacy, [facility], archived as an unknown outcome.

## 2023-11-29 ENCOUNTER — Telehealth: Payer: Medicare Other | Admitting: Physician Assistant

## 2023-11-29 DIAGNOSIS — J019 Acute sinusitis, unspecified: Secondary | ICD-10-CM

## 2023-11-29 DIAGNOSIS — B9689 Other specified bacterial agents as the cause of diseases classified elsewhere: Secondary | ICD-10-CM

## 2023-11-29 MED ORDER — AMOXICILLIN-POT CLAVULANATE 875-125 MG PO TABS: 1.0000 | ORAL_TABLET | Freq: Two times a day (BID) | ORAL | 0 refills | Status: DC

## 2023-11-29 NOTE — Progress Notes (Signed)

## 2023-12-14 DIAGNOSIS — C44719 Basal cell carcinoma of skin of left lower limb, including hip: Secondary | ICD-10-CM | POA: Diagnosis not present

## 2023-12-14 DIAGNOSIS — D485 Neoplasm of uncertain behavior of skin: Secondary | ICD-10-CM | POA: Diagnosis not present

## 2023-12-22 DIAGNOSIS — C44712 Basal cell carcinoma of skin of right lower limb, including hip: Secondary | ICD-10-CM | POA: Diagnosis not present

## 2023-12-27 DIAGNOSIS — C44719 Basal cell carcinoma of skin of left lower limb, including hip: Secondary | ICD-10-CM | POA: Diagnosis not present

## 2023-12-28 ENCOUNTER — Encounter: Payer: Self-pay | Admitting: Family Medicine

## 2024-01-10 ENCOUNTER — Ambulatory Visit (INDEPENDENT_AMBULATORY_CARE_PROVIDER_SITE_OTHER): Payer: Medicare Other | Admitting: Family Medicine

## 2024-01-10 ENCOUNTER — Encounter: Payer: Self-pay | Admitting: Family Medicine

## 2024-01-10 VITALS — BP 136/74 | HR 97 | Temp 97.6°F | Wt 285.4 lb

## 2024-01-10 DIAGNOSIS — M722 Plantar fascial fibromatosis: Secondary | ICD-10-CM | POA: Diagnosis not present

## 2024-01-10 MED ORDER — MELOXICAM 15 MG PO TABS: 15.0000 mg | ORAL_TABLET | Freq: Every day | ORAL | 0 refills | Status: DC

## 2024-01-10 NOTE — Patient Instructions (Addendum)
 Try stretching your arch before you get out of bed or up from sitting.   Roll a frozen water bottle under your arch when possible.  Stop ibuprofen.  Take meloxicam with food.  If not better, then ask about seeing Dr. Patsy Lager.  Bring in the shoes you wear the most and any inserts/pads.   Take care.  Glad to see you.

## 2024-01-10 NOTE — Progress Notes (Unsigned)
 R foot pain.  Taking 3 ibuprofen in the AM, tylenol in the PM.  Going on for about 1 month.  H/o  flares in the past that got better with stretching.  No help with stretching but hasn't tried before getting out of bed.  Pain with 1st step.  Plantar pain, at the heel.  Icing some.  Arch supports help some.  Lateral heel strike pattern on B shoes.    Meds, vitals, and allergies reviewed.   ROS: Per HPI unless specifically indicated in ROS section   Nad Ncat R foot with normal inspection. Med and lat mal not ttp Normal skin perfusion.  Painful area is at the origin of plantar fascia but foot isn't ttp o/w.

## 2024-01-12 DIAGNOSIS — M722 Plantar fascial fibromatosis: Secondary | ICD-10-CM | POA: Insufficient documentation

## 2024-01-12 NOTE — Assessment & Plan Note (Signed)
 Asked pt to try stretching his arch before getting out of bed or up from sitting.   Roll a frozen water bottle under the arch when possible.  Stop ibuprofen.  Take meloxicam with food. Nsaid cautions d/w pt.  If not better, then ask about seeing Dr. Patsy Lager.  Bring in the shoes worn the most and any inserts/pads.

## 2024-02-25 ENCOUNTER — Encounter: Payer: Self-pay | Admitting: Acute Care

## 2024-02-25 ENCOUNTER — Other Ambulatory Visit: Payer: Self-pay | Admitting: Emergency Medicine

## 2024-02-25 DIAGNOSIS — Z122 Encounter for screening for malignant neoplasm of respiratory organs: Secondary | ICD-10-CM

## 2024-02-25 DIAGNOSIS — Z87891 Personal history of nicotine dependence: Secondary | ICD-10-CM

## 2024-03-01 DIAGNOSIS — K080 Exfoliation of teeth due to systemic causes: Secondary | ICD-10-CM | POA: Diagnosis not present

## 2024-03-02 ENCOUNTER — Ambulatory Visit
Admission: RE | Admit: 2024-03-02 | Discharge: 2024-03-02 | Disposition: A | Discharge status: Home / Self Care | Source: Ambulatory Visit | Attending: Family Medicine | Admitting: Family Medicine

## 2024-03-02 DIAGNOSIS — Z122 Encounter for screening for malignant neoplasm of respiratory organs: Secondary | ICD-10-CM

## 2024-03-02 DIAGNOSIS — Z87891 Personal history of nicotine dependence: Secondary | ICD-10-CM | POA: Diagnosis not present

## 2024-03-20 DIAGNOSIS — K080 Exfoliation of teeth due to systemic causes: Secondary | ICD-10-CM | POA: Diagnosis not present

## 2024-03-31 ENCOUNTER — Other Ambulatory Visit: Payer: Self-pay | Admitting: Acute Care

## 2024-03-31 DIAGNOSIS — Z122 Encounter for screening for malignant neoplasm of respiratory organs: Secondary | ICD-10-CM

## 2024-03-31 DIAGNOSIS — Z87891 Personal history of nicotine dependence: Secondary | ICD-10-CM

## 2024-04-24 DIAGNOSIS — H52223 Regular astigmatism, bilateral: Secondary | ICD-10-CM | POA: Diagnosis not present

## 2024-04-26 DIAGNOSIS — C44712 Basal cell carcinoma of skin of right lower limb, including hip: Secondary | ICD-10-CM | POA: Diagnosis not present

## 2024-04-26 DIAGNOSIS — L820 Inflamed seborrheic keratosis: Secondary | ICD-10-CM | POA: Diagnosis not present

## 2024-04-26 DIAGNOSIS — L570 Actinic keratosis: Secondary | ICD-10-CM | POA: Diagnosis not present

## 2024-04-26 DIAGNOSIS — L538 Other specified erythematous conditions: Secondary | ICD-10-CM | POA: Diagnosis not present

## 2024-04-26 DIAGNOSIS — D485 Neoplasm of uncertain behavior of skin: Secondary | ICD-10-CM | POA: Diagnosis not present

## 2024-04-26 DIAGNOSIS — D225 Melanocytic nevi of trunk: Secondary | ICD-10-CM | POA: Diagnosis not present

## 2024-04-26 DIAGNOSIS — D2261 Melanocytic nevi of right upper limb, including shoulder: Secondary | ICD-10-CM | POA: Diagnosis not present

## 2024-04-26 DIAGNOSIS — D2272 Melanocytic nevi of left lower limb, including hip: Secondary | ICD-10-CM | POA: Diagnosis not present

## 2024-04-26 DIAGNOSIS — D2262 Melanocytic nevi of left upper limb, including shoulder: Secondary | ICD-10-CM | POA: Diagnosis not present

## 2024-05-15 ENCOUNTER — Encounter: Payer: Self-pay | Admitting: Family Medicine

## 2024-05-15 DIAGNOSIS — R739 Hyperglycemia, unspecified: Secondary | ICD-10-CM

## 2024-06-02 ENCOUNTER — Telehealth: Payer: Self-pay

## 2024-06-02 NOTE — Progress Notes (Signed)
 Complex Care Management Note  Care Guide Note 06/02/2024 Name: HARON BEILKE MRN: [id number] DOB: Jul 02, 1951  SUKHRAJ ESQUIVIAS is a 73 y.o. year old male who sees Cleatus Arlyss RAMAN, MD for primary care. I reached out to Elsie KANDICE Ellen by phone today to offer complex care management services.  Mr. Tavano was given information about Complex Care Management services today including:   The Complex Care Management services include support from the care team which includes your Nurse Care Manager, Clinical Social Worker, or Pharmacist.  The Complex Care Management team is here to help remove barriers to the health concerns and goals most important to you. Complex Care Management services are voluntary, and the patient may decline or stop services at any time by request to their care team member.   Complex Care Management Consent Status: Patient agreed to services and verbal consent obtained.   Follow up plan:  Telephone appointment with complex care management team member scheduled for:  06/07/24 at 11:00 a.m.   Encounter Outcome:  Patient Scheduled  Dreama [facility]  [facility], [facility] Health Care Management Assistant Direct Dial: [phone]  Fax: [phone]

## 2024-06-07 ENCOUNTER — Telehealth: Payer: Self-pay | Admitting: Family Medicine

## 2024-06-07 ENCOUNTER — Other Ambulatory Visit: Admitting: Pharmacist

## 2024-06-07 NOTE — Telephone Encounter (Signed)
 Copied from CRM [id number]. Topic: Clinical - Medication Question >> Jun 07, 2024  1:52 PM Aleatha C wrote: Reason for CRM: [facility] is calling they would like more information  about Patient using Wegovy,  they would like to know will the patient be using this to reduce  major cardio vascular events, what is the patient bmi before starting wegovy, and if the medication will use with a reduce  calorie diet and increase physical activity  please contact [place] [phone] option 5

## 2024-06-07 NOTE — Progress Notes (Signed)
   06/08/2024 Name: Jorge Benton MRN: [id number] DOB: 1951/02/22  Subjective  Chief Complaint  Patient presents with   Medication Access    Care Team: Primary Care Provider: Cleatus Arlyss RAMAN, MD  Reason for visit: ?  Jorge Benton is a 73 y.o. male who presents today for a telephone visit with the pharmacist due to medication access concerns regarding their Victoza .   Medication Access: ?  Reports that all medications are not affordable.  Reports he had been paying $160/month through some coupons. Now up to $380 at [facility].   Previous copay card expired and pharmacy states it is no longer available. With GoodRx down to $380.   He notes his wife is also on Victoza . Has the same insurance plan. Also w/o Dx of diabetes. So they are both paying this cost. He notes they feel the have the means to do this, though welcomes ideas for cost reduction given new cost is significantly higher.    Prescription drug coverage: YES Payor: BLUE CROSS BLUE SHIELD MEDICARE / Plan: BCBS MEDICARE / Product Type: *No Product type* /  -- Blue Medicare PPO Enhanced  Current Patient Assistance: No  Assessment and Plan:   1. Medication Access Victoza  appears to be cheapest GLP1 through GoodRx card Newer agents such as tirzepatide and semaglutide  programs offer these medications for ~$500/month which is significantly higher than current cost of Trulicity.  Patient aware Medicare does not cover any agents for weight loss benefits specifically.  Patient with diagnosis of CAD related to disease visualized on imaging. No personal Hx of ASCVD event.  Likely not covered though will attempt PA: Key = [id number] (PENDING) No documented hx OSA. Notes his wife does tell him he snores at night. Wife has been diagnosed w OSA and is on CPAP.  Encouraged patient to have wife speak w her provider about exploring Medicare coverage for Zepbound for OSA which could possibly reduce their household medication cost,  making Victoza  more affordable for him.   Future Appointments  Date Time Provider Department Center  11/07/2024  9:30 AM [facility] ANNUAL WELLNESS VISIT 1 [facility]  11/13/2024  7:30 AM [facility] LAB [facility]  11/20/2024  8:30 AM Cleatus Arlyss RAMAN, MD [facility]    Manuelita FABIENE Kobs, PharmD Clinical Pharmacist [facility] [phone]

## 2024-06-07 NOTE — Patient Instructions (Signed)
 [name],   It was a pleasure to speak with you today! As we discussed:?   Unfortunately, the savings programs/cards for the newer GLP1 medications including Ozempic /Wegovy and Mounjaro/Zepbound are not largely helpful. Typically, the lowest cash-price for these new agents is around $400-500 per month with the savings programs. GoodRx may be the best option for finding lowest price of Victoza . I have not seen other programs lower the cost more, sadly.   Regarding Medicare coverage of GLP1s to consider for you or your wife:  Some Medicare plans will agree to cover Zepbound specifically if prescribed for Obstructive Sleep Apnea.  Based on your [facility], covered brand medications are $47 per month which may be a way to get that monthly cost down for your household so you are not purchasing Victoza  twice each month. Her doctor would need to submit a prior authorization request in hopes of potentially getting it covered.   Please reach out prior to your next scheduled appointment should you have any questions or concerns.  Thank you!   Future Appointments  Date Time Provider Department Center  11/07/2024  9:30 AM [facility] ANNUAL WELLNESS VISIT 1 [facility]  11/13/2024  7:30 AM [facility] LAB [facility]  11/20/2024  8:30 AM Cleatus Arlyss RAMAN, MD [facility]   Manuelita FABIENE Kobs, PharmD Clinical Pharmacist [facility] [phone]

## 2024-06-08 NOTE — Telephone Encounter (Signed)
 I am not showing that this patient is prescribed wegovy. I see that he takes Victoza  but was told not to try anything else since he has been tolerating Victoza . Can you review before I reach out to insurance? Thank you

## 2024-06-11 NOTE — Telephone Encounter (Signed)
 Please check with patient.  I saw his note from [place] with pharmacy.  I thought he was still on Victoza .  Thanks.

## 2024-06-12 NOTE — Telephone Encounter (Unsigned)
 Copied from CRM [id number]. Topic: Clinical - Medication Prior Auth >> Jun 09, 2024  2:12 PM Viola F wrote: Reason for CRM: [employer] called regarding prior authorization for the [facility] - it is denied and they will be faxing over information today.

## 2024-06-12 NOTE — Telephone Encounter (Signed)
 I don't think that change to zepbound is going to be cheaper or significantly more effective. I would continue as is, assuming he is tolerating victoza .  Thanks.

## 2024-06-12 NOTE — Telephone Encounter (Signed)
 Spoke with patient and he states that he is taking the Victoza  so he is unclear as to why a authorization went for [facility]. But he does want to ask about possibly changing(or your opinion) about maybe trying Zepbound instead. Please advise.

## 2024-06-14 DIAGNOSIS — M9901 Segmental and somatic dysfunction of cervical region: Secondary | ICD-10-CM | POA: Diagnosis not present

## 2024-06-14 DIAGNOSIS — R519 Headache, unspecified: Secondary | ICD-10-CM | POA: Diagnosis not present

## 2024-06-14 NOTE — Telephone Encounter (Signed)
 Patient notified

## 2024-06-15 DIAGNOSIS — L905 Scar conditions and fibrosis of skin: Secondary | ICD-10-CM | POA: Diagnosis not present

## 2024-06-15 DIAGNOSIS — C44712 Basal cell carcinoma of skin of right lower limb, including hip: Secondary | ICD-10-CM | POA: Diagnosis not present

## 2024-06-15 DIAGNOSIS — M9901 Segmental and somatic dysfunction of cervical region: Secondary | ICD-10-CM | POA: Diagnosis not present

## 2024-06-15 DIAGNOSIS — R519 Headache, unspecified: Secondary | ICD-10-CM | POA: Diagnosis not present

## 2024-06-19 DIAGNOSIS — M9901 Segmental and somatic dysfunction of cervical region: Secondary | ICD-10-CM | POA: Diagnosis not present

## 2024-06-19 DIAGNOSIS — R519 Headache, unspecified: Secondary | ICD-10-CM | POA: Diagnosis not present

## 2024-06-21 DIAGNOSIS — M9901 Segmental and somatic dysfunction of cervical region: Secondary | ICD-10-CM | POA: Diagnosis not present

## 2024-06-21 DIAGNOSIS — R519 Headache, unspecified: Secondary | ICD-10-CM | POA: Diagnosis not present

## 2024-06-23 DIAGNOSIS — M9901 Segmental and somatic dysfunction of cervical region: Secondary | ICD-10-CM | POA: Diagnosis not present

## 2024-06-23 DIAGNOSIS — R519 Headache, unspecified: Secondary | ICD-10-CM | POA: Diagnosis not present

## 2024-06-27 DIAGNOSIS — M9901 Segmental and somatic dysfunction of cervical region: Secondary | ICD-10-CM | POA: Diagnosis not present

## 2024-06-27 DIAGNOSIS — R519 Headache, unspecified: Secondary | ICD-10-CM | POA: Diagnosis not present

## 2024-06-29 DIAGNOSIS — M9901 Segmental and somatic dysfunction of cervical region: Secondary | ICD-10-CM | POA: Diagnosis not present

## 2024-06-29 DIAGNOSIS — R519 Headache, unspecified: Secondary | ICD-10-CM | POA: Diagnosis not present

## 2024-07-10 DIAGNOSIS — M9901 Segmental and somatic dysfunction of cervical region: Secondary | ICD-10-CM | POA: Diagnosis not present

## 2024-07-10 DIAGNOSIS — R519 Headache, unspecified: Secondary | ICD-10-CM | POA: Diagnosis not present

## 2024-07-12 DIAGNOSIS — R519 Headache, unspecified: Secondary | ICD-10-CM | POA: Diagnosis not present

## 2024-07-12 DIAGNOSIS — M9901 Segmental and somatic dysfunction of cervical region: Secondary | ICD-10-CM | POA: Diagnosis not present

## 2024-07-19 DIAGNOSIS — R519 Headache, unspecified: Secondary | ICD-10-CM | POA: Diagnosis not present

## 2024-07-19 DIAGNOSIS — M9901 Segmental and somatic dysfunction of cervical region: Secondary | ICD-10-CM | POA: Diagnosis not present

## 2024-07-24 DIAGNOSIS — K080 Exfoliation of teeth due to systemic causes: Secondary | ICD-10-CM | POA: Diagnosis not present

## 2024-07-26 DIAGNOSIS — M9901 Segmental and somatic dysfunction of cervical region: Secondary | ICD-10-CM | POA: Diagnosis not present

## 2024-07-26 DIAGNOSIS — R519 Headache, unspecified: Secondary | ICD-10-CM | POA: Diagnosis not present

## 2024-08-09 DIAGNOSIS — M9901 Segmental and somatic dysfunction of cervical region: Secondary | ICD-10-CM | POA: Diagnosis not present

## 2024-08-09 DIAGNOSIS — R519 Headache, unspecified: Secondary | ICD-10-CM | POA: Diagnosis not present

## 2024-08-21 ENCOUNTER — Other Ambulatory Visit: Payer: Self-pay | Admitting: Family Medicine

## 2024-08-21 DIAGNOSIS — E785 Hyperlipidemia, unspecified: Secondary | ICD-10-CM

## 2024-09-06 DIAGNOSIS — R519 Headache, unspecified: Secondary | ICD-10-CM | POA: Diagnosis not present

## 2024-09-06 DIAGNOSIS — M9901 Segmental and somatic dysfunction of cervical region: Secondary | ICD-10-CM | POA: Diagnosis not present

## 2024-09-15 DIAGNOSIS — Z87891 Personal history of nicotine dependence: Secondary | ICD-10-CM | POA: Diagnosis not present

## 2024-10-04 DIAGNOSIS — M9901 Segmental and somatic dysfunction of cervical region: Secondary | ICD-10-CM | POA: Diagnosis not present

## 2024-10-04 DIAGNOSIS — R519 Headache, unspecified: Secondary | ICD-10-CM | POA: Diagnosis not present

## 2024-11-01 DIAGNOSIS — R519 Headache, unspecified: Secondary | ICD-10-CM | POA: Diagnosis not present

## 2024-11-01 DIAGNOSIS — M9901 Segmental and somatic dysfunction of cervical region: Secondary | ICD-10-CM | POA: Diagnosis not present

## 2024-11-08 ENCOUNTER — Other Ambulatory Visit: Payer: Self-pay | Admitting: Family Medicine

## 2024-11-08 DIAGNOSIS — E785 Hyperlipidemia, unspecified: Secondary | ICD-10-CM

## 2024-11-08 DIAGNOSIS — R739 Hyperglycemia, unspecified: Secondary | ICD-10-CM

## 2024-11-13 ENCOUNTER — Other Ambulatory Visit (INDEPENDENT_AMBULATORY_CARE_PROVIDER_SITE_OTHER): Payer: Medicare Other

## 2024-11-13 ENCOUNTER — Ambulatory Visit

## 2024-11-13 VITALS — Ht 69.69 in | Wt 285.0 lb

## 2024-11-13 DIAGNOSIS — Z0000 Encounter for general adult medical examination without abnormal findings: Secondary | ICD-10-CM | POA: Diagnosis not present

## 2024-11-13 DIAGNOSIS — E785 Hyperlipidemia, unspecified: Secondary | ICD-10-CM

## 2024-11-13 DIAGNOSIS — R739 Hyperglycemia, unspecified: Secondary | ICD-10-CM

## 2024-11-13 LAB — CBC WITH DIFFERENTIAL/PLATELET
Basophils Absolute: 0 K/uL (ref 0.0–0.1)
Basophils Relative: 0.3 % (ref 0.0–3.0)
Eosinophils Absolute: 0.1 K/uL (ref 0.0–0.7)
Eosinophils Relative: 1.8 % (ref 0.0–5.0)
HCT: 41.2 % (ref 39.0–52.0)
Hemoglobin: 13.7 g/dL (ref 13.0–17.0)
Lymphocytes Relative: 23.7 % (ref 12.0–46.0)
Lymphs Abs: 1.4 K/uL (ref 0.7–4.0)
MCHC: 33.3 g/dL (ref 30.0–36.0)
MCV: 85.7 fl (ref 78.0–100.0)
Monocytes Absolute: 0.7 K/uL (ref 0.1–1.0)
Monocytes Relative: 11.5 % (ref 3.0–12.0)
Neutro Abs: 3.6 K/uL (ref 1.4–7.7)
Neutrophils Relative %: 62.7 % (ref 43.0–77.0)
Platelets: 176 K/uL (ref 150.0–400.0)
RBC: 4.81 Mil/uL (ref 4.22–5.81)
RDW: 14.9 % (ref 11.5–15.5)
WBC: 5.7 K/uL (ref 4.0–10.5)

## 2024-11-13 LAB — HEMOGLOBIN A1C: Hgb A1c MFr Bld: 5.7 % (ref 4.6–6.5)

## 2024-11-13 LAB — LIPID PANEL
Cholesterol: 154 mg/dL (ref 28–200)
HDL: 34.3 mg/dL — ABNORMAL LOW
LDL Cholesterol: 103 mg/dL — ABNORMAL HIGH (ref 10–99)
NonHDL: 120.01
Total CHOL/HDL Ratio: 4
Triglycerides: 84 mg/dL (ref 10.0–149.0)
VLDL: 16.8 mg/dL (ref 0.0–40.0)

## 2024-11-13 LAB — COMPREHENSIVE METABOLIC PANEL WITH GFR
ALT: 17 U/L (ref 3–53)
AST: 16 U/L (ref 5–37)
Albumin: 3.9 g/dL (ref 3.5–5.2)
Alkaline Phosphatase: 70 U/L (ref 39–117)
BUN: 19 mg/dL (ref 6–23)
CO2: 30 meq/L (ref 19–32)
Calcium: 8.8 mg/dL (ref 8.4–10.5)
Chloride: 105 meq/L (ref 96–112)
Creatinine, Ser: 1.01 mg/dL (ref 0.40–1.50)
GFR: 73.95 mL/min
Glucose, Bld: 112 mg/dL — ABNORMAL HIGH (ref 70–99)
Potassium: 4.6 meq/L (ref 3.5–5.1)
Sodium: 143 meq/L (ref 135–145)
Total Bilirubin: 0.4 mg/dL (ref 0.2–1.2)
Total Protein: 6.1 g/dL (ref 6.0–8.3)

## 2024-11-13 LAB — TSH: TSH: 3.72 u[IU]/mL (ref 0.35–5.50)

## 2024-11-13 NOTE — Progress Notes (Signed)
 "  Chief Complaint  Patient presents with   Medicare Wellness     Subjective:   Jorge Benton is a 73 y.o. male who presents for a Medicare Annual Wellness Visit.  Visit info / Clinical Intake: Medicare Wellness Visit Type:: Subsequent Annual Wellness Visit Persons participating in visit and providing information:: patient Medicare Wellness Visit Mode:: Telephone If telephone:: video declined Since this visit was completed virtually, some vitals may be partially provided or unavailable. Missing vitals are due to the limitations of the virtual format.: Unable to obtain vitals - no equipment If Telephone or Video please confirm:: I connected with patient using audio/video enable telemedicine. I verified patient identity with two identifiers, discussed telehealth limitations, and patient agreed to proceed. Patient Location:: At work Provider Location:: Home Interpreter Needed?: No Pre-visit prep was completed: yes AWV questionnaire completed by patient prior to visit?: yes Date:: 11/12/24 Living arrangements:: (Patient-Rptd) lives with spouse/significant other Patient's Overall Health Status Rating: (Patient-Rptd) very good Typical amount of pain: (Patient-Rptd) some Does pain affect daily life?: (Patient-Rptd) no Are you currently prescribed opioids?: no  Dietary Habits and Nutritional Risks How many meals a day?: (Patient-Rptd) 3 Eats fruit and vegetables daily?: (Patient-Rptd) yes Most meals are obtained by: (Patient-Rptd) eating out In the last 2 weeks, have you had any of the following?: none Diabetic:: no  Functional Status Activities of Daily Living (to include ambulation/medication): (Patient-Rptd) Independent Ambulation: Independent with device- listed below Home Assistive Devices/Equipment: Eyeglasses Medication Administration: (Patient-Rptd) Independent Home Management (perform basic housework or laundry): (Patient-Rptd) Independent Manage your own finances?:  (Patient-Rptd) yes Primary transportation is: (Patient-Rptd) driving Concerns about vision?: no *vision screening is required for WTM* Concerns about hearing?: no  Fall Screening Falls in the past year?: (Patient-Rptd) 0 Number of falls in past year: 0 Was there an injury with Fall?: 0 Fall Risk Category Calculator: 0 Patient Fall Risk Level: Low Fall Risk  Fall Risk Patient at Risk for Falls Due to: No Fall Risks Fall risk Follow up: Falls evaluation completed; Falls prevention discussed  Home and Transportation Safety: All rugs have non-skid backing?: (Patient-Rptd) yes All stairs or steps have railings?: (Patient-Rptd) N/A, no stairs Grab bars in the bathtub or shower?: (!) (Patient-Rptd) no Have non-skid surface in bathtub or shower?: (Patient-Rptd) yes Good home lighting?: (Patient-Rptd) yes Regular seat belt use?: (Patient-Rptd) yes Hospital stays in the last year:: (Patient-Rptd) no  Cognitive Assessment Difficulty concentrating, remembering, or making decisions? : (Patient-Rptd) no Will 6CIT or Mini Cog be Completed: no 6CIT or Mini Cog Declined: patient alert, oriented, able to answer questions appropriately and recall recent events  Advance Directives (For Healthcare) Does Patient Have a Medical Advance Directive?: Yes Type of Advance Directive: Healthcare Power of [place]; Living will Copy of Healthcare Power of Attorney in Chart?: No - copy requested Copy of Living Will in Chart?: No - copy requested  Reviewed/Updated  Reviewed/Updated: Reviewed All (Medical, Surgical, Family, Medications, Allergies, Care Teams, Patient Goals)    Allergies (verified) Tramadol    Current Medications (verified) Outpatient Encounter Medications as of 11/13/2024  Medication Sig   cetirizine (ZYRTEC) 10 MG tablet Take 10 mg by mouth daily as needed.   cholecalciferol (VITAMIN D3) 25 MCG (1000 UNIT) tablet Take 1,000 Units by mouth daily.   Cyanocobalamin (VITAMIN B 12 PO) Take  by mouth.   Glucosamine 500 MG CAPS Take by mouth daily.   Green Tea, Camellia sinensis, (GREEN TEA EXTRACT PO) Take 2 capsules by mouth daily.   Insulin Pen Needle (  B-D UF III MINI PEN NEEDLES) 31G X 5 MM MISC USE WITH VICTOZA    liraglutide  (VICTOZA ) 18 MG/3ML SOPN Inject 1.2 mg into the skin daily.   meloxicam  (MOBIC ) 15 MG tablet Take 1 tablet (15 mg total) by mouth daily. With food.  Don't take with ibuprofen or aleve.   Multiple Vitamins-Minerals (CENTRUM SILVER PO) Take by mouth daily.   pravastatin  (PRAVACHOL ) 10 MG tablet TAKE ONE TABLET BY MOUTH ONCE DAILY   Pyridoxine HCl (B-6 PO) Take 1 capsule by mouth daily.   No facility-administered encounter medications on file as of 11/13/2024.    History: Past Medical History:  Diagnosis Date   Allergy    Blood transfusion without reported diagnosis 1967   with appendectomy    CAD (coronary artery disease)    Environmental allergies    Hyperlipidemia    Melanoma (HCC) 06/2010   [facility]   Sinusitis 1968   severe in Hospital   Past Surgical History:  Procedure Laterality Date   APPENDECTOMY  1965   COLONOSCOPY     HERNIA REPAIR  1990   MOHS SURGERY     06/2010- melanoma L thigh   POLYPECTOMY     SKIN SURGERY Left 01/2021   upper left chest   Family History  Problem Relation Age of Onset   Uterine cancer Mother        hysterectomy 81, chemo and radiation   Hypothyroidism Mother    Hypertension Mother    Heart disease Father        MI after surgery   Stroke Maternal Grandfather    Colon cancer Neg Hx    Prostate cancer Neg Hx    Pancreatic cancer Neg Hx    Stomach cancer Neg Hx    Colon polyps Neg Hx    Esophageal cancer Neg Hx    Rectal cancer Neg Hx    Social History   Occupational History   Occupation: Scientist, Product/process Development  Tobacco Use   Smoking status: Former    Current packs/day: 0.00    Average packs/day: 1 pack/day for 45.0 years (45.0 ttl pk-yrs)    Types: Cigarettes    Start date: 37     Quit date: 2012    Years since quitting: 14.0   Smokeless tobacco: Never  Vaping Use   Vaping status: Never Used  Substance and Sexual Activity   Alcohol use: Yes    Alcohol/week: 1.0 standard drink of alcohol    Types: 1 Standard drinks or equivalent per week    Comment: occasional   Drug use: No   Sexual activity: Not on file   Tobacco Counseling Counseling given: Not Answered  SDOH Screenings   Food Insecurity: No Food Insecurity (11/13/2024)  Housing: Unknown (11/13/2024)  Transportation Needs: No Transportation Needs (11/13/2024)  Utilities: Not At Risk (11/13/2024)  Alcohol Screen: Low Risk (11/03/2024)  Depression (PHQ2-9): Low Risk (11/13/2024)  Financial Resource Strain: Low Risk (11/03/2024)  Physical Activity: Insufficiently Active (11/13/2024)  Social Connections: Socially Integrated (11/13/2024)  Stress: No Stress Concern Present (11/13/2024)  Tobacco Use: Medium Risk (11/13/2024)  Health Literacy: Adequate Health Literacy (11/13/2024)   See flowsheets for full screening details  Depression Screen PHQ 2 & 9 Depression Scale- Over the past 2 weeks, how often have you been bothered by any of the following problems? Little interest or pleasure in doing things: 0 Feeling down, depressed, or hopeless (PHQ Adolescent also includes...irritable): 0 PHQ-2 Total Score: 0 Trouble falling or staying asleep, or sleeping too  much: 0 Feeling tired or having little energy: 0 Poor appetite or overeating (PHQ Adolescent also includes...weight loss): 0 Feeling bad about yourself - or that you are a failure or have let yourself or your family down: 0 Trouble concentrating on things, such as reading the newspaper or watching television (PHQ Adolescent also includes...like school work): 0 Moving or speaking so slowly that other people could have noticed. Or the opposite - being so fidgety or restless that you have been moving around a lot more than usual: 0 Thoughts that you would  be better off dead, or of hurting yourself in some way: 0 PHQ-9 Total Score: 0 If you checked off any problems, how difficult have these problems made it for you to do your work, take care of things at home, or get along with other people?: Not difficult at all  Depression Treatment Depression Interventions/Treatment : EYV7-0 Score <4 Follow-up Not Indicated     Goals Addressed             This Visit's Progress    Patient Stated   On track    Would like to drink more water and increase exercise to 3 days a week. Will lose another 10lbs. Will keep water goals             Objective:    Today's Vitals   11/13/24 1349  Weight: 285 lb (129.3 kg)  Height: 5' 9.69 (1.77 m)   Body mass index is 41.26 kg/m.  Hearing/Vision screen Hearing Screening - Comments:: Denies hearing difficulties   Vision Screening - Comments:: Wears eyeglasses/UTD/[employer] [place] Eyewear  Immunizations and Health Maintenance Health Maintenance  Topic Date Due   Zoster Vaccines- Shingrix (1 of 2) 09/11/2001   DTaP/Tdap/Td (3 - Td or Tdap) 09/13/2023   Influenza Vaccine  06/16/2024   Lung Cancer Screening  03/02/2025   Medicare Annual Wellness (AWV)  11/13/2025   Colonoscopy  01/20/2026   Pneumococcal Vaccine: 50+ Years  Completed   Hepatitis C Screening  Completed   Meningococcal B Vaccine  Aged Out   COVID-19 Vaccine  Discontinued        Assessment/Plan:  This is a routine wellness examination for Jorge Benton.  Patient Care Team: Cleatus Arlyss RAMAN, MD as PCP - General (Family Medicine) Josue Oneil ORN, DDS as Referring Physician (Dentistry) [place], [place], OD (Optometry) Dasher, Alm LABOR, MD (Dermatology)  I have personally reviewed and noted the following in the patients chart:   Medical and social history Use of alcohol, tobacco or illicit drugs  Current medications and supplements including opioid prescriptions. Functional ability and status Nutritional status Physical  activity Advanced directives List of other physicians Hospitalizations, surgeries, and ER visits in previous 12 months Vitals Screenings to include cognitive, depression, and falls Referrals and appointments  No orders of the defined types were placed in this encounter.  In addition, I have reviewed and discussed with patient certain preventive protocols, quality metrics, and best practice recommendations. A written personalized care plan for preventive services as well as general preventive health recommendations were provided to patient.   Jorge Benton, CMA   11/13/2024   Return in 1 year (on 11/13/2025).  After Visit Summary: (MyChart) Due to this being a telephonic visit, the after visit summary with patients personalized plan was offered to patient via MyChart   Nurse Notes: Patient is due for a flu vaccine a Tdap and a Shingrix vaccine.  He stated that he would like to get his flu vaccine during his  upcoming visit with provider.  Patient had no other concerns to address today. "

## 2024-11-13 NOTE — Patient Instructions (Addendum)
 Jorge Benton,  Thank you for taking the time for your Medicare Wellness Visit. I appreciate your continued commitment to your health goals. Please review the care plan we discussed, and feel free to reach out if I can assist you further.  Please note that Annual Wellness Visits do not include a physical exam. Some assessments may be limited, especially if the visit was conducted virtually. If needed, we may recommend an in-person follow-up with your provider.  Ongoing Care Seeing your primary care provider every 3 to 6 months helps us  monitor your health and provide consistent, personalized care. Your next office visit on 11/20/2024.  You are due for a flu vaccine, a tetanus vaccine and a Shingles vaccine.  Aim for 30 minutes of exercise or brisk walking, 6-8 glasses of water, and 5 servings of fruits and vegetables each day.   Referrals If a referral was made during today's visit and you haven't received any updates within two weeks, please contact the referred provider directly to check on the status.  Recommended Screenings:  Health Maintenance  Topic Date Due   Zoster (Shingles) Vaccine (1 of 2) 09/11/2001   DTaP/Tdap/Td vaccine (3 - Td or Tdap) 09/13/2023   Flu Shot  06/16/2024   Screening for Lung Cancer  03/02/2025   Medicare Annual Wellness Visit  11/13/2025   Colon Cancer Screening  01/20/2026   Pneumococcal Vaccine for age over 34  Completed   Hepatitis C Screening  Completed   Meningitis B Vaccine  Aged Out   COVID-19 Vaccine  Discontinued       11/12/2024    2:41 PM  Advanced Directives  Does Patient Have a Medical Advance Directive? Yes  Type of Estate Agent of [place];Living will  Copy of Healthcare Power of Attorney in Chart? No - copy requested    Vision: Annual vision screenings are recommended for early detection of glaucoma, cataracts, and diabetic retinopathy. These exams can also reveal signs of chronic conditions such as diabetes and  high blood pressure.  Dental: Annual dental screenings help detect early signs of oral cancer, gum disease, and other conditions linked to overall health, including heart disease and diabetes.  Please see the attached documents for additional preventive care recommendations.

## 2024-11-14 DIAGNOSIS — K080 Exfoliation of teeth due to systemic causes: Secondary | ICD-10-CM | POA: Diagnosis not present

## 2024-11-19 ENCOUNTER — Ambulatory Visit: Payer: Self-pay | Admitting: Family Medicine

## 2024-11-20 ENCOUNTER — Encounter: Payer: Self-pay | Admitting: Family Medicine

## 2024-11-20 ENCOUNTER — Ambulatory Visit: Payer: Medicare Other | Admitting: Family Medicine

## 2024-11-20 ENCOUNTER — Telehealth: Payer: Self-pay

## 2024-11-20 ENCOUNTER — Other Ambulatory Visit (HOSPITAL_COMMUNITY): Payer: Self-pay

## 2024-11-20 VITALS — BP 142/90 | HR 87 | Temp 98.2°F | Ht 69.5 in | Wt 280.2 lb

## 2024-11-20 DIAGNOSIS — E669 Obesity, unspecified: Secondary | ICD-10-CM | POA: Diagnosis not present

## 2024-11-20 DIAGNOSIS — E785 Hyperlipidemia, unspecified: Secondary | ICD-10-CM

## 2024-11-20 DIAGNOSIS — Z7189 Other specified counseling: Secondary | ICD-10-CM

## 2024-11-20 DIAGNOSIS — Z23 Encounter for immunization: Secondary | ICD-10-CM

## 2024-11-20 DIAGNOSIS — Z0000 Encounter for general adult medical examination without abnormal findings: Secondary | ICD-10-CM

## 2024-11-20 MED ORDER — PRAVASTATIN SODIUM 10 MG PO TABS: 10.0000 mg | ORAL_TABLET | Freq: Every day | ORAL | 3 refills | Status: DC

## 2024-11-20 MED ORDER — PRAVASTATIN SODIUM 10 MG PO TABS: 10.0000 mg | ORAL_TABLET | Freq: Every day | ORAL | 3 refills | Status: AC

## 2024-11-20 MED ORDER — LIRAGLUTIDE 18 MG/3ML ~~LOC~~ SOPN: 1.8000 mg | PEN_INJECTOR | Freq: Every day | SUBCUTANEOUS | 12 refills | Status: DC

## 2024-11-20 MED ORDER — AZITHROMYCIN 500 MG PO TABS: 500.0000 mg | ORAL_TABLET | Freq: Every day | ORAL | 0 refills | Status: AC

## 2024-11-20 NOTE — Progress Notes (Signed)
 Flu vaccine - 2026 PNA up to date Tdap 2014 shingrix d/w pt. covid vaccine prev done, d/w pt.  Colonoscopy 2022, d/w pt about follow up.   Prostate cancer screening and PSA options (with potential risks and benefits of testing vs not testing) were discussed along with recent recs/guidelines.  He declined testing PSA at this point. AAA screen prev done.   Advance directive-wife designated if patient were incapacitated. Lung cancer screening program d/w pt. Not yet due for f/u screening as of 11/2024  Recent chest congestion is clearly better now.  Prev with cough with some sputum.  No fevers.     Elevated Cholesterol/CAD.  Using medications without problems: yes Muscle aches: no Diet compliance: d/w pt.  Exercise: d/w pt.   He had insomnia with 20mg  pravastatin  but not with 10mg .     He is still seeing dermatology re: h/o melanoma.     He is taking liraglutide  1.2 mg daily.  No ADE on med.  No abd or GI sx.  D/w pt about trying higher dose in combination with diet and exercise.    Travelling to [place] in the near future.     Meds, vitals, and allergies reviewed.    ROS: Per HPI unless specifically indicated in ROS section    GEN: nad, alert and oriented HEENT: ncat NECK: supple w/o LA CV: rrr. PULM: ctab, no inc wob ABD: soft, +bs EXT: no edema SKIN: no acute rash

## 2024-11-20 NOTE — Patient Instructions (Addendum)
 Please call GI about follow up.  [facility] [phone]  Let me know if you don't tolerate and see benefit with the higher dose of victoza .  Flu shot today.  Tdap at pharmacy when possible.  Take care.  Glad to see you.

## 2024-11-20 NOTE — Telephone Encounter (Signed)
 Pharmacy Patient Advocate Encounter   Received notification from Onbase that prior authorization for Victoza  18 is required/requested.   Unable to verify insurance. The patient is insured through [facility]   Per test claim:

## 2024-11-23 ENCOUNTER — Other Ambulatory Visit: Payer: Self-pay | Admitting: Family Medicine

## 2024-11-24 NOTE — Telephone Encounter (Signed)
Pt states he pays out of pocket for this medication.

## 2024-11-26 NOTE — Assessment & Plan Note (Signed)
 He had insomnia with 20mg  pravastatin  but not with 10mg .   Continue as is.

## 2024-11-26 NOTE — Assessment & Plan Note (Signed)
 He had been taking liraglutide  1.2 mg daily.  No ADE on med.  No abd or GI sx.  D/w pt about trying higher dose in combination with diet and exercise, ie increase up to 1.8 mg daily.  Routine cautions discussed with patient.  He can let me know if he does not tolerate it.

## 2024-11-26 NOTE — Assessment & Plan Note (Signed)
 Advance directive- wife designated if patient were incapacitated.

## 2024-11-26 NOTE — Assessment & Plan Note (Signed)
 Flu vaccine - 2026 PNA up to date Tdap 2014 shingrix d/w pt. covid vaccine prev done, d/w pt.  Colonoscopy 2022, d/w pt about follow up.   Prostate cancer screening and PSA options (with potential risks and benefits of testing vs not testing) were discussed along with recent recs/guidelines.  He declined testing PSA at this point. AAA screen prev done.   Advance directive-wife designated if patient were incapacitated. Lung cancer screening program d/w pt. Not yet due for f/u screening as of 11/2024

## 2024-11-28 MED ORDER — LIRAGLUTIDE -WEIGHT MANAGEMENT 18 MG/3ML ~~LOC~~ SOPN: 1.8000 mg | PEN_INJECTOR | Freq: Every day | SUBCUTANEOUS | 12 refills | Status: AC

## 2024-11-28 NOTE — Addendum Note (Signed)
 Addended by: KALLIE CLOTILDA SQUIBB on: 11/28/2024 10:52 AM   Modules accepted: Orders

## 2024-12-07 ENCOUNTER — Emergency Department

## 2024-12-07 ENCOUNTER — Other Ambulatory Visit: Payer: Self-pay

## 2024-12-07 ENCOUNTER — Emergency Department
Admission: EM | Admit: 2024-12-07 | Discharge: 2024-12-08 | Disposition: A | Discharge status: Home / Self Care | Attending: Emergency Medicine | Admitting: Emergency Medicine

## 2024-12-07 DIAGNOSIS — Z23 Encounter for immunization: Secondary | ICD-10-CM | POA: Diagnosis not present

## 2024-12-07 DIAGNOSIS — M79652 Pain in left thigh: Secondary | ICD-10-CM | POA: Diagnosis not present

## 2024-12-07 DIAGNOSIS — W108XXA Fall (on) (from) other stairs and steps, initial encounter: Secondary | ICD-10-CM | POA: Diagnosis not present

## 2024-12-07 DIAGNOSIS — S0990XA Unspecified injury of head, initial encounter: Secondary | ICD-10-CM

## 2024-12-07 DIAGNOSIS — S0031XA Abrasion of nose, initial encounter: Secondary | ICD-10-CM | POA: Diagnosis not present

## 2024-12-07 DIAGNOSIS — W19XXXA Unspecified fall, initial encounter: Secondary | ICD-10-CM

## 2024-12-07 DIAGNOSIS — M25462 Effusion, left knee: Secondary | ICD-10-CM | POA: Diagnosis not present

## 2024-12-07 DIAGNOSIS — M79605 Pain in left leg: Secondary | ICD-10-CM

## 2024-12-07 DIAGNOSIS — S0993XA Unspecified injury of face, initial encounter: Secondary | ICD-10-CM | POA: Diagnosis present

## 2024-12-07 DIAGNOSIS — S60416A Abrasion of right little finger, initial encounter: Secondary | ICD-10-CM | POA: Diagnosis not present

## 2024-12-07 DIAGNOSIS — S3993XA Unspecified injury of pelvis, initial encounter: Secondary | ICD-10-CM | POA: Diagnosis present

## 2024-12-07 LAB — CBC WITH DIFFERENTIAL/PLATELET
Abs Immature Granulocytes: 0.03 K/uL (ref 0.00–0.07)
Basophils Absolute: 0 K/uL (ref 0.0–0.1)
Basophils Relative: 0 %
Eosinophils Absolute: 0.1 K/uL (ref 0.0–0.5)
Eosinophils Relative: 2 %
HCT: 41.9 % (ref 39.0–52.0)
Hemoglobin: 13.4 g/dL (ref 13.0–17.0)
Immature Granulocytes: 0 %
Lymphocytes Relative: 31 %
Lymphs Abs: 2.7 K/uL (ref 0.7–4.0)
MCH: 28.5 pg (ref 26.0–34.0)
MCHC: 32 g/dL (ref 30.0–36.0)
MCV: 89 fL (ref 80.0–100.0)
Monocytes Absolute: 1.1 K/uL — ABNORMAL HIGH (ref 0.1–1.0)
Monocytes Relative: 12 %
Neutro Abs: 4.9 K/uL (ref 1.7–7.7)
Neutrophils Relative %: 55 %
Platelets: 233 K/uL (ref 150–400)
RBC: 4.71 MIL/uL (ref 4.22–5.81)
RDW: 14.6 % (ref 11.5–15.5)
WBC: 8.9 K/uL (ref 4.0–10.5)
nRBC: 0 % (ref 0.0–0.2)

## 2024-12-07 MED ORDER — HYDROMORPHONE HCL 1 MG/ML IJ SOLN: 0.5000 mg | Freq: Once | INTRAMUSCULAR | Status: DC

## 2024-12-07 MED ORDER — OXYCODONE HCL 5 MG PO TABS
5.0000 mg | ORAL_TABLET | Freq: Once | ORAL | Status: AC
  Administered 2024-12-07: 5 mg via ORAL
  Filled 2024-12-07: qty 1

## 2024-12-07 MED ORDER — FENTANYL CITRATE (PF) 50 MCG/ML IJ SOSY
75.0000 ug | PREFILLED_SYRINGE | Freq: Once | INTRAMUSCULAR | Status: AC
  Administered 2024-12-08: 75 ug via INTRAVENOUS
  Filled 2024-12-07: qty 2

## 2024-12-07 MED ORDER — ACETAMINOPHEN 500 MG PO TABS
1000.0000 mg | ORAL_TABLET | Freq: Once | ORAL | Status: AC
  Administered 2024-12-07: 1000 mg via ORAL
  Filled 2024-12-07: qty 2

## 2024-12-07 MED ORDER — LIDOCAINE 5 % EX PTCH
1.0000 | MEDICATED_PATCH | CUTANEOUS | Status: DC
  Administered 2024-12-07: 1 via TRANSDERMAL
  Filled 2024-12-07: qty 1

## 2024-12-07 MED ORDER — TETANUS-DIPHTH-ACELL PERTUSSIS 5-2-15.5 LF-MCG/0.5 IM SUSP
0.5000 mL | Freq: Once | INTRAMUSCULAR | Status: AC
  Administered 2024-12-07: 0.5 mL via INTRAMUSCULAR
  Filled 2024-12-07: qty 0.5

## 2024-12-07 NOTE — ED Provider Notes (Addendum)
 "  [facility] Provider Note    Event Date/Time   First MD Initiated Contact with Patient 12/07/24 2130     (approximate)   History   Fall   HPI  Jorge Benton is a 74 y.o. male who comes in for mechanical fall.  Patient reports that the light was not turned on of his step so he missed a step and fell onto his face.  He denies any syncope, chest pain, shortness of breath, abdominal pain, chest pain or any other concerns.  He reports hitting his face and hurting his left thigh and having a small laceration to his right hand.  Denies any other issues.  No blood thinners.  Patient has been on ambulatory   Physical Exam   Triage Vital Signs: ED Triage Vitals [12/07/24 2127]  Encounter Vitals Group     BP      Girls Systolic BP Percentile      Girls Diastolic BP Percentile      Boys Systolic BP Percentile      Boys Diastolic BP Percentile      Pulse      Resp      Temp      Temp src      SpO2 98 %     Weight      Height      Head Circumference      Peak Flow      Pain Score      Pain Loc      Pain Education      Exclude from Growth Chart     Most recent vital signs: Vitals:   12/07/24 2127  SpO2: 98%     General: Awake, no distress.  CV:  Good peripheral perfusion.  Resp:  Normal effort.  Abd:  No distention.  Other:  Left thigh with old surgical scars noted.  No hematoma no bruising no swelling, soft compartments.  Patient unable to do a straight leg raise secondary to pain in the thigh area.  Is able to slightly bend at the knee and denies any knee pain.  He really denies any pain at the hip itself.  Small abrasion noted on the nose.  No septal hematoma.  No chest pain, no abdominal pain.  Small abrasion noted on the right pinky finger.  Peripheral nerves all intact good distal pulse.  No snuffbox tenderness. Pt denies any back pain.   ED Results / Procedures / Treatments   Labs (all labs ordered are listed, but only abnormal  results are displayed) Labs Reviewed  CBC WITH DIFFERENTIAL/PLATELET  BASIC METABOLIC PANEL WITH GFR     EKG  My interpretation of EKG:  Normal sinus rate of 90 without any ST elevation or T wave inversions, normal intervals  RADIOLOGY I have reviewed the xray personally and interpreted no evidence of any fracture   PROCEDURES:  Critical Care performed: No  Procedures   MEDICATIONS ORDERED IN ED: Medications  acetaminophen  (TYLENOL ) tablet 1,000 mg (has no administration in time range)  lidocaine  (LIDODERM ) 5 % 1 patch (has no administration in time range)  fentaNYL  (SUBLIMAZE ) injection 75 mcg (has no administration in time range)  oxyCODONE  (Oxy IR/ROXICODONE ) immediate release tablet 5 mg (5 mg Oral Given 12/07/24 2245)  Tdap (ADACEL ) injection 0.5 mL (0.5 mLs Intramuscular Given 12/07/24 2245)     IMPRESSION / MDM / ASSESSMENT AND PLAN / ED COURSE  I reviewed the triage vital signs and the nursing notes.  Patient's presentation is most consistent with acute presentation with potential threat to life or bodily function.   Differential includes cervical fracture, facial fracture, intracranial hemorrhage, hip fracture, knee injury.  Patient given oxycodone  to help with pain and updated tetanus.  Patient only had some abrasions over his nose and his pinky finger.  These were washed out.  Some glue was placed on top but nothing that could be sutured.  CT imaging was negative.  Patient unable to ambulate.  Significant pain on his left femur area.  Will get CT pelvis and CT femur.  On repeat evaluation continues to not have any hematoma, bruising, rapidly expanding leg to suggest active bleeding.  Patient feels like it is more like a issue with the muscle.  Patient could have quadricep injury.  Considered MRI but these were unlikely to be read overnight time so we will start off with CT imaging continue to treat patient's pain to see if he can ambulate.  Will get some preop labs  and patient will be handed off to oncoming team.  The patient is on the cardiac monitor to evaluate for evidence of arrhythmia and/or significant heart rate changes.      FINAL CLINICAL IMPRESSION(S) / ED DIAGNOSES   Final diagnoses:  Fall, initial encounter     Rx / DC Orders   ED Discharge Orders     None        Note:  This document was prepared using Dragon voice recognition software and may include unintentional dictation errors.   Ernest Ronal BRAVO, MD 12/07/24 2350    Ernest Ronal BRAVO, MD 12/07/24 2357  "

## 2024-12-07 NOTE — ED Notes (Signed)
 This RN and Zoyie RN attempted to ambulate pt. Pt was able to stand with assist. Pt unable to bear weight on left leg. Pt was able to shuffle a few steps. Pt c/o left leg pain and numbness above left knee. Pt ambulated back onto stretcher with assistance of staff. Pt's stretcher in lowest, locked position with call bell in reach.

## 2024-12-07 NOTE — ED Provider Notes (Signed)
 11:43 PM  Assumed care at shift change.  Patient here after mechanical fall.  Complaining of left thigh pain.  X-rays show no acute abnormality, CT head, face and cervical spine also unremarkable.  CT of the femur pending.  Able to stand with shuffling gait.  Lives at home.  12:19  AM  Pt's CT scans reviewed and interpreted by myself and the radiologist and show no acute abnormality.  Will reattempt ambulation.  Given additional pain medication here.  12:53 AM  Pt reports feeling much better after additional pain medication.  Able to ambulate on his own with steady gait.  He declines admission and is comfortable with plan for discharge home.  States he has a regulatory affairs officer to [employer] in the morning.  I think that he is safe to go but will discharge with pain medication and recommended rest, ice, elevation.  Patient and wife comfortable with this plan.  Will send pain medication to local 24-hour pharmacy.   At this time, I do not feel there is any life-threatening condition present. I reviewed all nursing notes, vitals, pertinent previous records.  All lab and urine results, EKGs, imaging ordered have been independently reviewed and interpreted by myself.  I reviewed all available radiology reports from any imaging ordered this visit.  Based on my assessment, I feel the patient is safe to be discharged home without further emergent workup and can continue workup as an outpatient as needed. Discussed all findings, treatment plan as well as usual and customary return precautions.  They verbalize understanding and are comfortable with this plan.  Outpatient follow-up has been provided as needed.  All questions have been answered.    Electa Sterry, Josette SAILOR, DO 12/08/24 [phone]

## 2024-12-07 NOTE — ED Triage Notes (Signed)
 Pt arrives from home via [facility] with c/o fall this evening. Per EMS, pt went to take a step, and missed the step resulting in a fall. Per EMS, pt c/o left thigh pain with no noticeable deformity, right pinky laceration, and laceration to bridge of nose. Per EMS, pt takes a statin, GLP and pt denies blood thinners. Pt states that he normally turns the light on to the porch but did not this evening and also reports that there are normally black rugs on the steps but they were off due to being cleaned. Pt reports missing step and falling on face. Pt c/o 6/10 left thigh pain. Pt reports not being able to put pressure on left leg. Pt denies taking blood thinners. Pt A&Ox4.

## 2024-12-08 LAB — BASIC METABOLIC PANEL WITH GFR
Anion gap: 11 (ref 5–15)
BUN: 18 mg/dL (ref 8–23)
CO2: 26 mmol/L (ref 22–32)
Calcium: 9.4 mg/dL (ref 8.9–10.3)
Chloride: 105 mmol/L (ref 98–111)
Creatinine, Ser: 0.97 mg/dL (ref 0.61–1.24)
GFR, Estimated: >60 mL/min
Glucose, Bld: 117 mg/dL — ABNORMAL HIGH (ref 70–99)
Potassium: 3.7 mmol/L (ref 3.5–5.1)
Sodium: 141 mmol/L (ref 135–145)

## 2024-12-08 MED ORDER — DOCUSATE SODIUM 100 MG PO CAPS: 100.0000 mg | ORAL_CAPSULE | Freq: Two times a day (BID) | ORAL | 0 refills | Status: AC

## 2024-12-08 MED ORDER — OXYCODONE HCL 5 MG PO TABS: 5.0000 mg | ORAL_TABLET | Freq: Three times a day (TID) | ORAL | 0 refills | Status: AC | PRN

## 2024-12-08 MED ORDER — ONDANSETRON 4 MG PO TBDP: 4.0000 mg | ORAL_TABLET | Freq: Four times a day (QID) | ORAL | 0 refills | Status: AC | PRN

## 2024-12-08 NOTE — Discharge Instructions (Addendum)
 CT scans of your head, neck and face, pelvis and left femur showed no acute abnormality today.  X-ray of your extremities were also normal.  I recommend that you apply ice to your leg, elevate it when at rest, may wrap with an Ace wrap if needed for discomfort.  You may take Tylenol  1000 mg every 6 hours as needed for pain.  If this is not enough for pain control, I have also sent a prescription for oxycodone  to a 24-hour pharmacy located at [address] in [place], [place] that is just off of [address].  I have confirmed that this [facility] is 24 hours.  You are being provided a prescription for opiates (also known as narcotics) for pain control.  Opiates can be addictive and should only be used when absolutely necessary for pain control when other alternatives do not work.  We recommend you only use them for the recommended amount of time and only as prescribed.  Please do not take with other sedative medications or alcohol.  Please do not drive, operate machinery, make important decisions while taking opiates.  Please note that these medications can be addictive and have high abuse potential.  Patients can become addicted to narcotics after only taking them for a few days.  Please keep these medications locked away from children, teenagers or any family members with history of substance abuse.  Narcotic pain medicine may also make you constipated.  You may use over-the-counter medications such as MiraLAX, Colace to prevent constipation.  If you become constipated, you may use over-the-counter enemas as needed.  Itching and nausea are also common side effects of narcotic pain medication.  If you develop uncontrolled vomiting or a rash, please stop these medications and seek medical care.  HAVE A GREAT TRIP!!!

## 2024-12-08 NOTE — ED Notes (Signed)
 Pt ambulated in room with stand by assistance of staff. Pt able to bear weight and stand without assistance of staff. EDP Ward notified. Pt back on stretcher with bed in lowest, locked position with call bell in reach.

## 2024-12-12 ENCOUNTER — Encounter: Payer: Self-pay | Admitting: Family Medicine

## 2024-12-13 ENCOUNTER — Ambulatory Visit: Payer: Self-pay

## 2024-12-13 NOTE — Progress Notes (Unsigned)
" ° ° ° °  Chance Munter T. Jaszmine Navejas, MD, CAQ Sports Medicine [facility] at [facility] [address] [place], [address]  Phone: [phone]  FAX: [phone]  RUBEN MAHLER - 74 y.o. male  MRN [id number]  Date of Birth: [birth year]  Date: 12/14/2024  PCP: Cleatus Arlyss RAMAN, MD  Referral: Cleatus Arlyss RAMAN, MD  No chief complaint on file.  Subjective:   Jorge Benton is a 74 y.o. very pleasant male patient with There is no height or weight on file to calculate BMI. who presents with the following:  Discussed the use of AI scribe software for clinical note transcription with the patient, who gave verbal consent to proceed.  Patient presents with left leg pain after a fall.  He fell on December 07, 2024, went to the ER and has some leg pain and also had a head injury.   History of Present Illness     Review of Systems is noted in the HPI, as appropriate  Objective:   There were no vitals taken for this visit.  GEN: No acute distress; alert,appropriate. PULM: Breathing comfortably in no respiratory distress PSYCH: Normally interactive.   Laboratory and Imaging Data:  Assessment and Plan:   No diagnosis found. Assessment & Plan   Medication Management during today's office visit: No orders of the defined types were placed in this encounter.  There are no discontinued medications.  Orders placed today for conditions managed today: No orders of the defined types were placed in this encounter.   Disposition: No follow-ups on file.  [employer] One speech-to-text software was used for transcription in this dictation.  Possible transcriptional errors can occur using Animal nutritionist.   Signed,  Jacques DASEN. Ashleen Demma, MD   Outpatient Encounter Medications as of 12/14/2024  Medication Sig   azithromycin  (ZITHROMAX ) 500 MG tablet Take 1 tablet (500 mg total) by mouth daily. For traveller's diarrhea, if needed.   cetirizine (ZYRTEC) 10 MG  tablet Take 10 mg by mouth daily as needed.   cholecalciferol (VITAMIN D3) 25 MCG (1000 UNIT) tablet Take 1,000 Units by mouth daily.   Cyanocobalamin (VITAMIN B 12 PO) Take by mouth.   docusate sodium  (COLACE) 100 MG capsule Take 1 capsule (100 mg total) by mouth 2 (two) times daily.   Glucosamine 500 MG CAPS Take by mouth daily.   Green Tea, Camellia sinensis, (GREEN TEA EXTRACT PO) Take 2 capsules by mouth daily.   Insulin Pen Needle (B-D UF III MINI PEN NEEDLES) 31G X 5 MM MISC USE WITH VICTOZA    Liraglutide  -Weight Management (SAXENDA ) 18 MG/3ML SOPN Inject 1.8 mg into the skin daily.   Liraglutide  -Weight Management 18 MG/3ML SOPN INJECT 1.8 MG UNDER THE SKIN ONCE DAILY   Multiple Vitamins-Minerals (CENTRUM SILVER PO) Take by mouth daily.   ondansetron  (ZOFRAN -ODT) 4 MG disintegrating tablet Take 1 tablet (4 mg total) by mouth every 6 (six) hours as needed for nausea or vomiting.   oxyCODONE  (ROXICODONE ) 5 MG immediate release tablet Take 1 tablet (5 mg total) by mouth every 8 (eight) hours as needed.   pravastatin  (PRAVACHOL ) 10 MG tablet Take 1 tablet (10 mg total) by mouth daily.   Pyridoxine HCl (B-6 PO) Take 1 capsule by mouth daily.   No facility-administered encounter medications on file as of 12/14/2024.   "

## 2024-12-13 NOTE — Telephone Encounter (Signed)
 FYI Only or Action Required?: FYI only for provider: appointment scheduled on 1/29.  Patient was last seen in primary care on 11/20/2024 by Cleatus Arlyss RAMAN, MD.  Called Nurse Triage reporting Leg Pain and Leg Injury.  Symptoms began a week ago.  Interventions attempted: Prescription medications: Oxycodone  IR, Rest, hydration, or home remedies, and Ice/heat application.  Symptoms are: gradually worsening.  Triage Disposition: See PCP When Office is Open (Within 3 Days)  Patient/caregiver understands and will follow disposition?: Yes    Fell last Thursday after accidentally falling off a step. Onset of left thigh pain. Went to hospital. CT scan and Xray negative. Discharged.   Now with increased pain in left thigh and moderate swelling in left ankle. 3/10 constant pain in left thigh. Able to walk around with cane. No open areas. No CP or SOB. Scheduled appt with different provider at home office tomorrow d/t no PCP availability within timeframe. Advised UC or ED for worsening symptoms.    Message from Bertha G sent at 12/13/2024  9:47 AM EST  Reason for Triage: Pt fell last Tuesday and went to the hospital at [facility]. Pt left leg is still sore and is more swollen.   Reason for Disposition  [1] After 3 days AND [2] pain not improved  Answer Assessment - Initial Assessment Questions 1. MECHANISM: How did the injury happen? (e.g., twisting injury, direct blow)      Slipped off step accidentally  2. ONSET: When did the injury happen? (e.g., minutes, hours ago)      Last Thursday   3. LOCATION: Where is the injury located?      Left thigh and ankle  4. APPEARANCE of INJURY: What does the injury look like?  (e.g., deformity of leg)     Thigh normal, left ankle and up leg with moderate swelling  5. SEVERITY: Can you put weight on that leg? Can you walk?      Able to walk but with a cane  6. SIZE: For cuts, bruises, or swelling, ask: How large is it? (e.g., inches or  centimeters)      Denies  7. PAIN: Is there pain? If Yes, ask: How bad is the pain?   What does it keep you from doing? (Scale 0-10; or none, mild, moderate, severe)     3/10 constant  8. TETANUS: For any breaks in the skin, ask: When was your last tetanus booster?     N/a  9. OTHER SYMPTOMS: Do you have any other symptoms?      No  Protocols used: Leg Injury-A-AH

## 2024-12-13 NOTE — Telephone Encounter (Signed)
 Thank you for getting patient scheduled.

## 2024-12-14 ENCOUNTER — Ambulatory Visit: Admitting: Family Medicine

## 2024-12-14 ENCOUNTER — Encounter: Payer: Self-pay | Admitting: Family Medicine

## 2024-12-14 VITALS — BP 134/84 | HR 89 | Temp 97.8°F | Ht 69.5 in | Wt 285.1 lb

## 2024-12-14 DIAGNOSIS — S7012XD Contusion of left thigh, subsequent encounter: Secondary | ICD-10-CM

## 2024-12-15 NOTE — Telephone Encounter (Signed)
 Noted. Thanks.

## 2025-11-15 ENCOUNTER — Other Ambulatory Visit

## 2025-11-22 ENCOUNTER — Encounter: Admitting: Family Medicine
